# Patient Record
Sex: Female | Born: 1945 | Race: White | Hispanic: No | Smoking: Current every day smoker
Health system: Southern US, Community
[De-identification: ages and names within clinical notes are randomized; demographics above are authoritative.]

## PROBLEM LIST (undated history)

## (undated) DIAGNOSIS — C44300 Unspecified malignant neoplasm of skin of unspecified part of face: Secondary | ICD-10-CM

## (undated) DIAGNOSIS — E0590 Thyrotoxicosis, unspecified without thyrotoxic crisis or storm: Secondary | ICD-10-CM

## (undated) HISTORY — DX: Thyrotoxicosis, unspecified without thyrotoxic crisis or storm: E05.90

## (undated) HISTORY — PX: LAPAROSCOPIC TUBAL LIGATION: SUR803

## (undated) HISTORY — DX: Unspecified malignant neoplasm of skin of unspecified part of face: C44.300

## (undated) HISTORY — PX: FOOT SURGERY: SHX648

## (undated) HISTORY — PX: HAND SURGERY: SHX662

---

## 2000-08-11 HISTORY — PX: BREAST BIOPSY: SHX20

## 2007-09-15 ENCOUNTER — Ambulatory Visit: Payer: Self-pay | Admitting: Urology

## 2008-12-05 IMAGING — CT CT ABDOMEN AND PELVIS WITHOUT AND WITH CONTRAST
2 of 4 series · 13 of 32 positions shown, 18 images · non-contrast
Comparison: none

REASON FOR EXAM: Hematuria, renal colic, history of renal mass
COMMENTS:

[Series 2: wo · axial · 0.61mm/px · z∈[-1024,-684]mm · 8 of 88 slices shown, 13 images]
[im 10/88  soft-tissue]
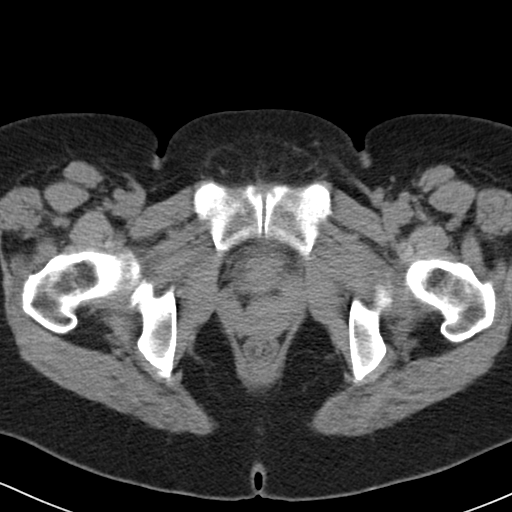
[im 10/88  bone]
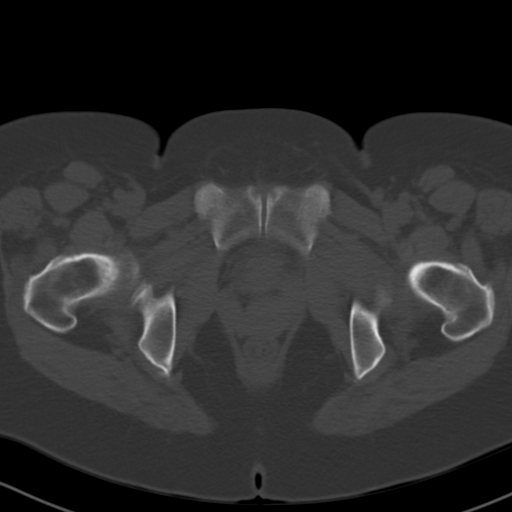
[im 20/88  soft-tissue]
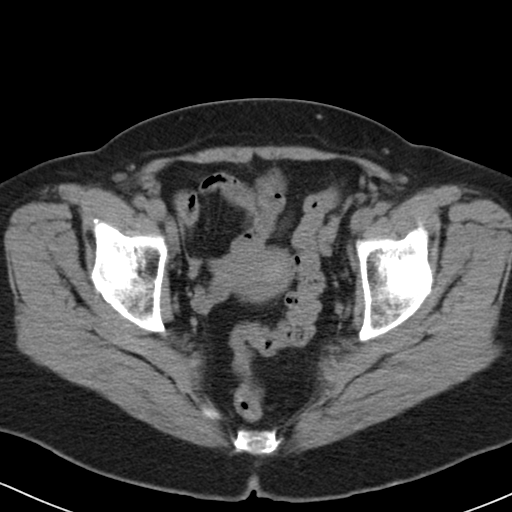
[im 30/88  soft-tissue]
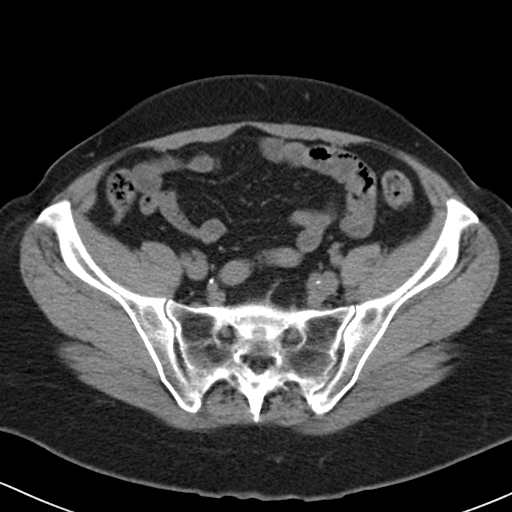
[im 39/88  soft-tissue]
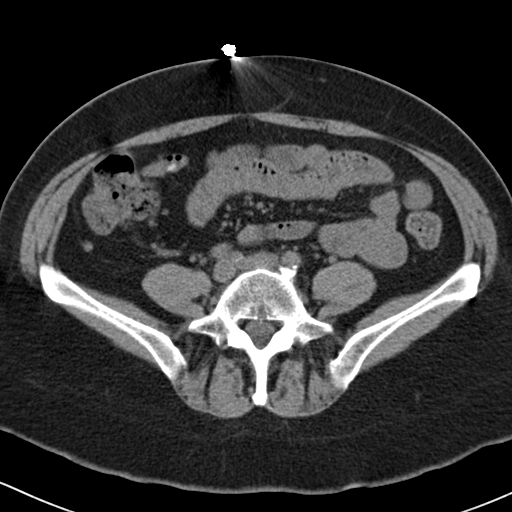
[im 49/88  soft-tissue]
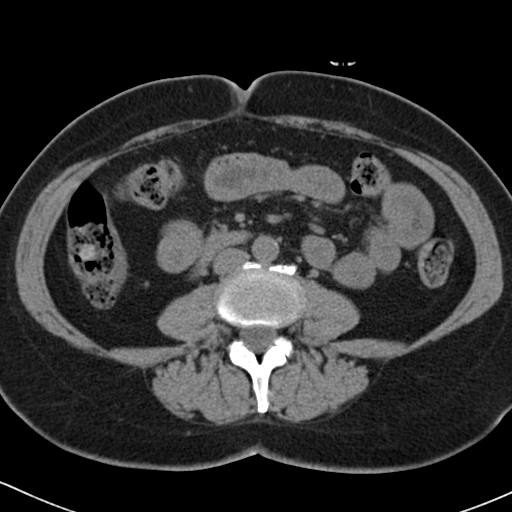
[im 49/88  lung]
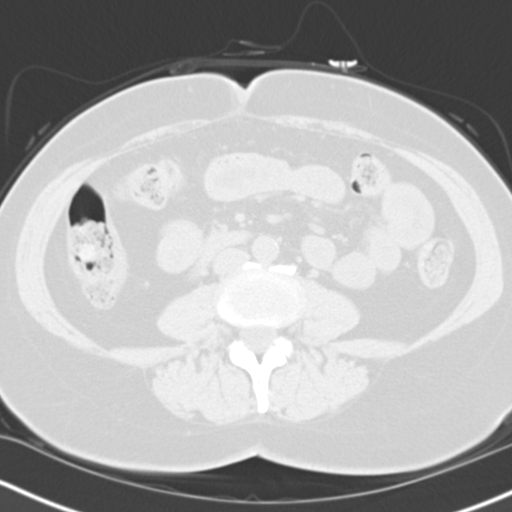
[im 59/88  soft-tissue]
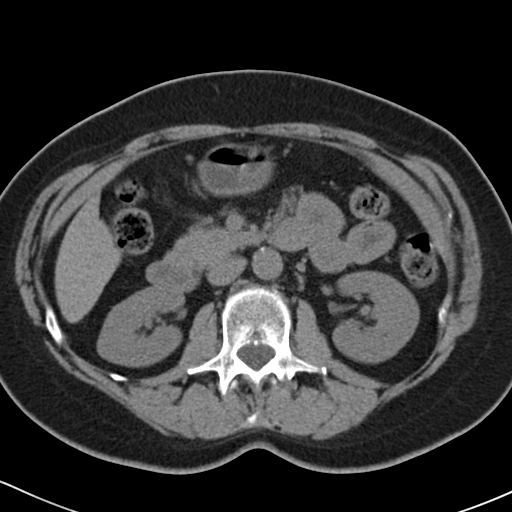
[im 59/88  lung]
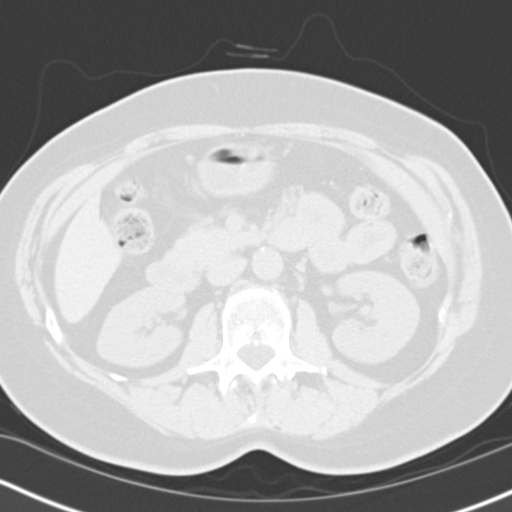
[im 68/88  soft-tissue]
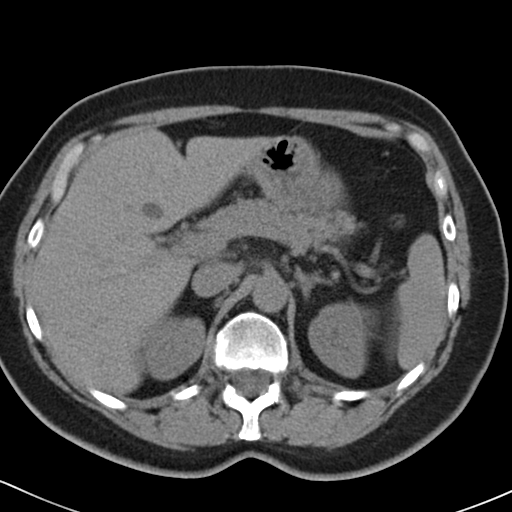
[im 68/88  lung]
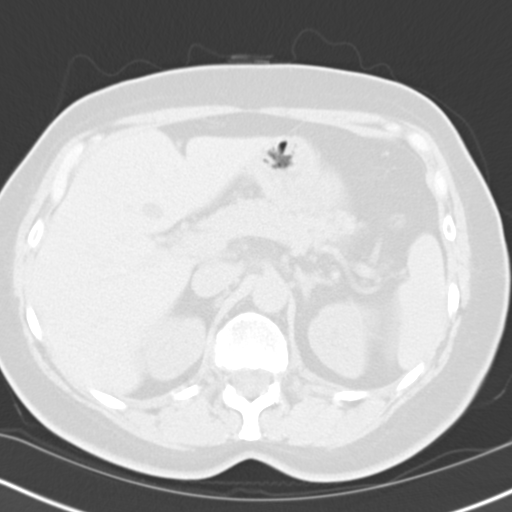
[im 78/88  soft-tissue]
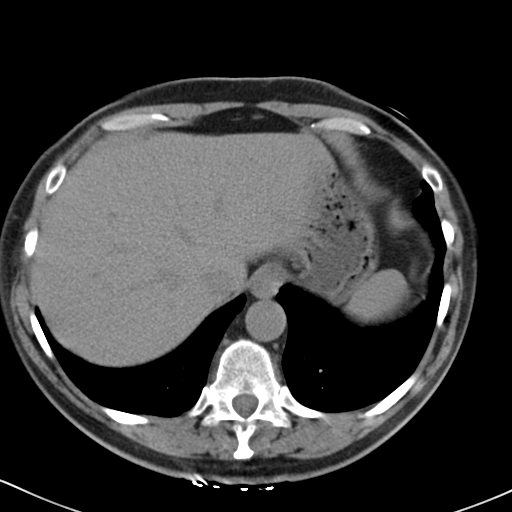
[im 78/88  lung]
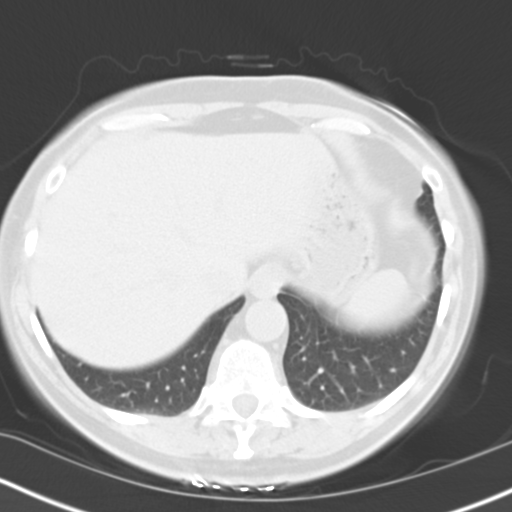

[Series 3: with · axial · 0.61mm/px · z∈[-1024,-829]mm · 5 of 88 slices shown]
[im 10/88  soft-tissue]
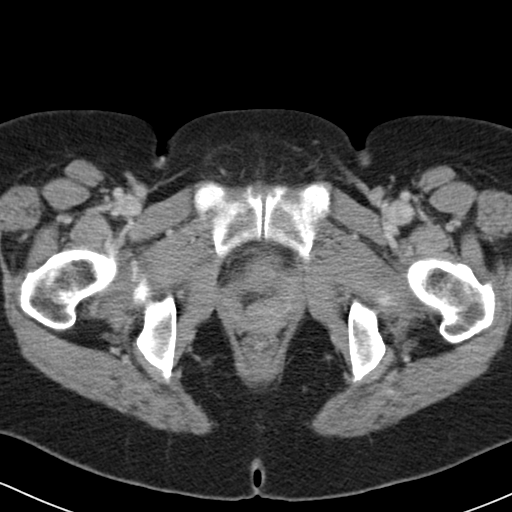
[im 20/88  soft-tissue]
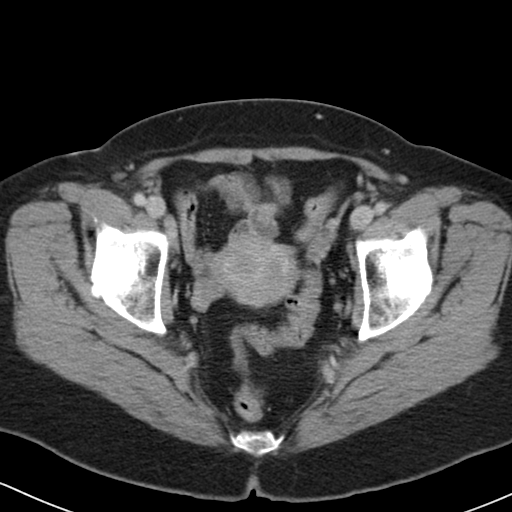
[im 30/88  soft-tissue]
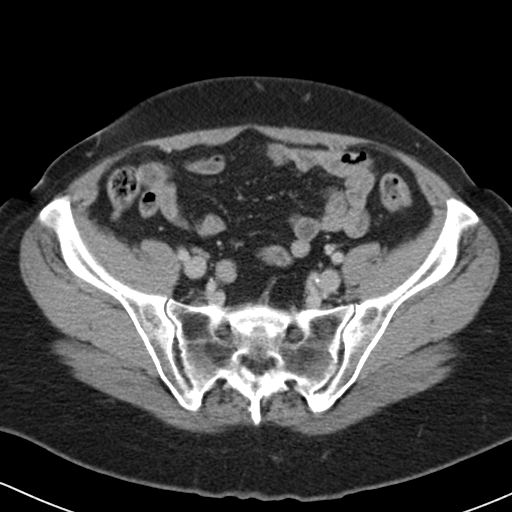
[im 39/88  soft-tissue]
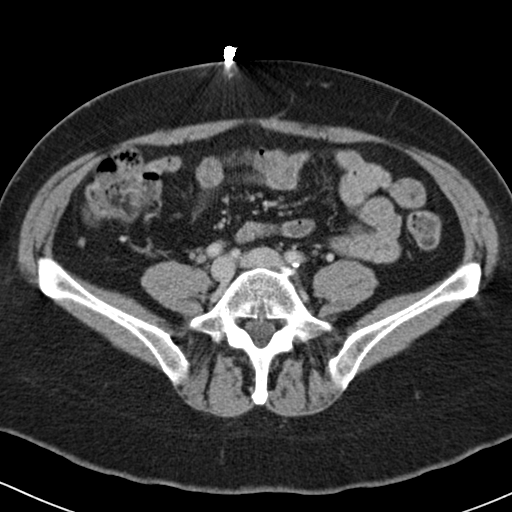
[im 49/88  soft-tissue]
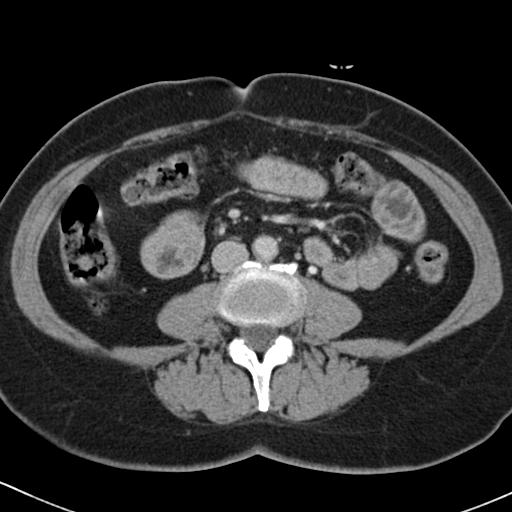

[13 of 32 positions shown; findings below may reference images not displayed]

PROCEDURE:     CT  - CT ABDOMEN / PELVIS  W/WO  - September 15, 2007  [DATE]

RESULT:     Triphasic scan was performed due to history of renal mass, renal
colic and hematuria.

Neither the RIGHT nor LEFT kidney exhibits evidence of obstruction or
calcified stones. There is a subcentimeter hypodensity in the upper pole of
the RIGHT kidney laterally measuring minus 42 Hounsfield units compatible
with a fat containing structure such as an angiomyolipoma. No calcifications
are demonstrated within either kidney. Following intravenous administration
of 100 ml of [id number], the kidneys enhance well. The subtle hypodensity in
the upper pole on the RIGHT laterally remains hypodense. The intrarenal
collecting systems and ureters are normal in appearance on the delayed
images. The partially distended urinary bladder is grossly normal.

The liver exhibits a hypodensity in the LEFT lobe measuring approximately
1.5 x 1.9 cm compatible with a cyst. There is no intrahepatic ductal
dilation. The gallbladder, stomach, spleen, adrenal glands and pancreas
exhibit no acute abnormality. The caliber of the abdominal aorta is normal.
The lung bases are clear. The unopacified loops of small and large bowel
exhibit no evidence of ileus or obstruction. The appendix is demonstrated
and appears normal. There are calcifications within the uterus and there is
heterogeneous density within the uterus. This likely reflects the presence
of fibroids. Further evaluation with Pelvic Ultrasound would be of value.
There may be a tiny cyst in the RIGHT ovary.
IMPRESSION: 1.  I do not see acute abnormality of either kidney. Specifically, no
calcified stones are noted. A tiny, fat containing lesion in the upper pole
on the RIGHT is present.
2.  I see no acute hepatobiliary abnormality.
3.  No acute bowel abnormality is seen.
4.  There is heterogeneous density within the uterus. Pelvic Ultrasound is
recommended.

## 2015-09-10 ENCOUNTER — Ambulatory Visit (INDEPENDENT_AMBULATORY_CARE_PROVIDER_SITE_OTHER): Payer: PPO | Admitting: Family Medicine

## 2015-09-10 ENCOUNTER — Ambulatory Visit
Admission: RE | Admit: 2015-09-10 | Discharge: 2015-09-10 | Disposition: A | Discharge status: Home / Self Care | Payer: PPO | Source: Ambulatory Visit | Attending: Family Medicine | Admitting: Family Medicine

## 2015-09-10 ENCOUNTER — Encounter: Payer: Self-pay | Admitting: Family Medicine

## 2015-09-10 VITALS — BP 157/95 | HR 85 | Temp 98.2°F | Resp 16 | Ht 62.0 in | Wt 158.0 lb

## 2015-09-10 DIAGNOSIS — R69 Illness, unspecified: Principal | ICD-10-CM

## 2015-09-10 DIAGNOSIS — R05 Cough: Secondary | ICD-10-CM | POA: Insufficient documentation

## 2015-09-10 DIAGNOSIS — J111 Influenza due to unidentified influenza virus with other respiratory manifestations: Secondary | ICD-10-CM

## 2015-09-10 DIAGNOSIS — J40 Bronchitis, not specified as acute or chronic: Secondary | ICD-10-CM

## 2015-09-10 DIAGNOSIS — R0602 Shortness of breath: Secondary | ICD-10-CM | POA: Diagnosis not present

## 2015-09-10 LAB — POCT INFLUENZA A/B
INFLUENZA A, POC: NEGATIVE
Influenza B, POC: NEGATIVE

## 2015-09-10 MED ORDER — FLUTICASONE PROPIONATE HFA 110 MCG/ACT IN AERO: 1.0000 | INHALATION_SPRAY | Freq: Two times a day (BID) | RESPIRATORY_TRACT | Status: DC

## 2015-09-10 MED ORDER — ALBUTEROL SULFATE HFA 108 (90 BASE) MCG/ACT IN AERS: 2.0000 | INHALATION_SPRAY | Freq: Four times a day (QID) | RESPIRATORY_TRACT | Status: DC | PRN

## 2015-09-10 MED ORDER — LEVOFLOXACIN 500 MG PO TABS: 500.0000 mg | ORAL_TABLET | Freq: Every day | ORAL | Status: DC

## 2015-09-10 MED ORDER — OSELTAMIVIR PHOSPHATE 75 MG PO CAPS: 75.0000 mg | ORAL_CAPSULE | Freq: Two times a day (BID) | ORAL | Status: DC

## 2015-09-10 NOTE — Progress Notes (Signed)
Name: Sarah Adkins   MRN: [id number]    DOB: 16-Jun-1946   Date:09/10/2015       Progress Note  Subjective  Chief Complaint  Chief Complaint  Patient presents with  . New Patient (Initial Visit)  . Cough    HPI Here to establish care.  She has \\developed  a cough with chills and fever (100.1).  Sx x 3 days.  Cough productive of clear sputum.  She is a 1/2 ppd smoker.  Her brother in law ans sister have been dx'd with the flu  She has HBP, post herpetic neuralgia, hypothyroidism.  No problem-specific assessment & plan notes found for this encounter.   Past Medical History  Diagnosis Date  . Hypertension   . Hyperthyroidism     Past Surgical History  Procedure Laterality Date  . Hand surgery Bilateral   . Laparoscopic tubal ligation    . Foot surgery Left     Family History  Problem Relation Age of Onset  . Stroke Mother   . Heart attack Mother   . Hypertension Mother   . Congestive Heart Failure Father   . Seizures Brother     Social History   Social History  . Marital Status: Married    Spouse Name: N/A  . Number of Children: N/A  . Years of Education: N/A   Occupational History  . Not on file.   Social History Main Topics  . Smoking status: Current Every Day Smoker -- 0.50 packs/day for 50 years    Types: Cigarettes  . Smokeless tobacco: Never Used  . Alcohol Use: 0.0 oz/week    0 Standard drinks or equivalent per week     Comment: occasional beer  . Drug Use: No  . Sexual Activity: Not on file   Other Topics Concern  . Not on file   Social History Narrative  . No narrative on file     Current outpatient prescriptions:  .  aspirin EC 325 MG tablet, Take 325 mg by mouth daily., Disp: , Rfl:  .  atorvastatin (LIPITOR) 20 MG tablet, Take 1 tablet by mouth at bedtime., Disp: , Rfl:  .  Cholecalciferol (VITAMIN D3) 1000 units CAPS, Take 1,000 Units by mouth daily., Disp: , Rfl:  .  Cyanocobalamin-Salcaprozate 1000-100 MCG-MG TABS, Take 1 tablet by  mouth daily., Disp: , Rfl:  .  gabapentin (NEURONTIN) 100 MG capsule, Take 100 mg by mouth 4 (four) times daily. , Disp: , Rfl: 3 .  hydrochlorothiazide (HYDRODIURIL) 25 MG tablet, Take 25 mg by mouth daily., Disp: , Rfl:  .  levothyroxine (SYNTHROID, LEVOTHROID) 100 MCG tablet, Take 1 tablet by mouth every morning., Disp: , Rfl:  .  lisinopril (PRINIVIL,ZESTRIL) 20 MG tablet, Take 20 mg by mouth daily., Disp: , Rfl: 2 .  metoprolol succinate (TOPROL-XL) 50 MG 24 hr tablet, Take 50 mg by mouth 2 (two) times daily., Disp: , Rfl:  .  PREVNAR 13 SUSP injection, Inject 0.5 mLs into the muscle as directed., Disp: , Rfl: 0  Allergies  Allergen Reactions  . Erythromycin Hives     Review of Systems  Constitutional: Positive for fever, chills and malaise/fatigue. Negative for weight loss.  HENT: Negative for hearing loss.   Eyes: Negative for blurred vision and double vision.  Respiratory: Negative for cough, shortness of breath and wheezing.   Cardiovascular: Negative for chest pain, palpitations and leg swelling.  Gastrointestinal: Negative for heartburn, abdominal pain and blood in stool.  Genitourinary: Negative for dysuria, urgency  and frequency.  Musculoskeletal: Positive for myalgias. Negative for joint pain.  Skin: Negative for rash.  Neurological: Positive for weakness. Negative for headaches.      Objective  Filed Vitals:   09/10/15 1007  BP: 157/95  Pulse: 85  Temp: 98.2 F (36.8 C)  TempSrc: Oral  Resp: 16  Height: 5\' 2"  (1.575 m)  Weight: 158 lb (71.668 kg)    Physical Exam  Constitutional: She is well-developed, well-nourished, and in no distress. No distress.  HENT:  Head: Normocephalic and atraumatic.  Right Ear: External ear normal.  Left Ear: External ear normal.  Nose: Nose normal.  Mouth/Throat: Oropharynx is clear and moist. No oropharyngeal exudate.  Neck: No thyromegaly present.  Cardiovascular: Normal rate, regular rhythm and normal heart sounds.   Exam reveals no gallop and no friction rub.   No murmur heard. Pulmonary/Chest: Effort normal and breath sounds normal. No respiratory distress. She has no wheezes. She has no rales.  Lymphadenopathy:    She has no cervical adenopathy.  Vitals reviewed.      No results found for this or any previous visit (from the past 2160 hour(s)).   Assessment & Plan  Problem List Items Addressed This Visit    None      Meds ordered this encounter  Medications  . DISCONTD: lisinopril-hydrochlorothiazide (PRINZIDE,ZESTORETIC) 20-12.5 MG tablet    Sig: Take 1 tablet by mouth daily. Reported on 09/10/2015  . atorvastatin (LIPITOR) 20 MG tablet    Sig: Take 1 tablet by mouth at bedtime.  Marland Kitchen levothyroxine (SYNTHROID, LEVOTHROID) 100 MCG tablet    Sig: Take 1 tablet by mouth every morning.  . hydrochlorothiazide (HYDRODIURIL) 25 MG tablet    Sig: Take 25 mg by mouth daily.  . metoprolol succinate (TOPROL-XL) 50 MG 24 hr tablet    Sig: Take 50 mg by mouth 2 (two) times daily.  . Cholecalciferol (VITAMIN D3) 1000 units CAPS    Sig: Take 1,000 Units by mouth daily.  Marland Kitchen aspirin EC 325 MG tablet    Sig: Take 325 mg by mouth daily.  Marland Kitchen gabapentin (NEURONTIN) 100 MG capsule    Sig: Take 100 mg by mouth 4 (four) times daily.     Refill:  3  . lisinopril (PRINIVIL,ZESTRIL) 20 MG tablet    Sig: Take 20 mg by mouth daily.    Refill:  2  . PREVNAR 13 SUSP injection    Sig: Inject 0.5 mLs into the muscle as directed.    Refill:  0  . Cyanocobalamin-Salcaprozate 1000-100 MCG-MG TABS    Sig: Take 1 tablet by mouth daily.    1. Influenza-like illness  - DG Chest 2 View; Future - oseltamivir (TAMIFLU) 75 MG capsule; Take 1 capsule (75 mg total) by mouth 2 (two) times daily.  Dispense: 10 capsule; Refill: 0 - POCT Influenza A/B-neg  2. Bronchitis  - albuterol (PROVENTIL HFA;VENTOLIN HFA) 108 (90 Base) MCG/ACT inhaler; Inhale 2 puffs into the lungs every 6 (six) hours as needed for wheezing or  shortness of breath.  Dispense: 1 Inhaler; Refill: 0 - levofloxacin (LEVAQUIN) 500 MG tablet; Take 1 tablet (500 mg total) by mouth daily.  Dispense: 7 tablet; Refill: 0 - fluticasone (FLOVENT HFA) 110 MCG/ACT inhaler; Inhale 1 puff into the lungs 2 (two) times daily. Rinse mouth with water after use.  Dispense: 1 Inhaler; Refill: 12

## 2015-09-10 NOTE — Patient Instructions (Addendum)
Advised to stop smoking.  Drink plenty of fluids.

## 2015-10-01 ENCOUNTER — Ambulatory Visit: Payer: PPO | Admitting: Family Medicine

## 2015-10-02 ENCOUNTER — Ambulatory Visit (INDEPENDENT_AMBULATORY_CARE_PROVIDER_SITE_OTHER): Payer: PPO | Admitting: Family Medicine

## 2015-10-02 ENCOUNTER — Encounter: Payer: Self-pay | Admitting: Family Medicine

## 2015-10-02 VITALS — BP 135/65 | HR 62 | Temp 98.1°F | Resp 16 | Ht 62.0 in | Wt 159.4 lb

## 2015-10-02 DIAGNOSIS — I10 Essential (primary) hypertension: Secondary | ICD-10-CM | POA: Insufficient documentation

## 2015-10-02 DIAGNOSIS — E038 Other specified hypothyroidism: Secondary | ICD-10-CM | POA: Diagnosis not present

## 2015-10-02 DIAGNOSIS — E039 Hypothyroidism, unspecified: Secondary | ICD-10-CM | POA: Insufficient documentation

## 2015-10-02 DIAGNOSIS — E034 Atrophy of thyroid (acquired): Secondary | ICD-10-CM | POA: Diagnosis not present

## 2015-10-02 DIAGNOSIS — B0229 Other postherpetic nervous system involvement: Secondary | ICD-10-CM

## 2015-10-02 NOTE — Progress Notes (Signed)
Name: Sarah Adkins   MRN: [id number]    DOB: [birth year]   Date:10/02/2015       Progress Note  Subjective  Chief Complaint  Chief Complaint  Patient presents with  . Hypertension    Follow up    HPI Here for f/u of HBP and hypothyroidism.  She has Post herpetic neuralgia.    She is recovering from the flu and bronchitis.    No problem-specific assessment & plan notes found for this encounter.   Past Medical History  Diagnosis Date  . Hypertension   . Hyperthyroidism     Past Surgical History  Procedure Laterality Date  . Hand surgery Bilateral   . Laparoscopic tubal ligation    . Foot surgery Left     Family History  Problem Relation Age of Onset  . Stroke Mother   . Heart attack Mother   . Hypertension Mother   . Congestive Heart Failure Father   . Seizures Brother     Social History   Social History  . Marital Status: Married    Spouse Name: N/A  . Number of Children: N/A  . Years of Education: N/A   Occupational History  . Not on file.   Social History Main Topics  . Smoking status: Current Every Day Smoker -- 0.50 packs/day for 50 years    Types: Cigarettes  . Smokeless tobacco: Never Used  . Alcohol Use: 0.0 oz/week    0 Standard drinks or equivalent per week     Comment: occasional beer  . Drug Use: No  . Sexual Activity: Not on file   Other Topics Concern  . Not on file   Social History Narrative     Current outpatient prescriptions:  .  albuterol (PROVENTIL HFA;VENTOLIN HFA) 108 (90 Base) MCG/ACT inhaler, Inhale 2 puffs into the lungs every 6 (six) hours as needed for wheezing or shortness of breath., Disp: 1 Inhaler, Rfl: 0 .  aspirin EC 325 MG tablet, Take 325 mg by mouth daily., Disp: , Rfl:  .  atorvastatin (LIPITOR) 20 MG tablet, Take 1 tablet by mouth at bedtime., Disp: , Rfl:  .  Cholecalciferol (VITAMIN D3) 1000 units CAPS, Take 1,000 Units by mouth daily., Disp: , Rfl:  .  Cyanocobalamin-Salcaprozate 1000-100 MCG-MG TABS, Take 1  tablet by mouth daily., Disp: , Rfl:  .  fluticasone (FLOVENT HFA) 110 MCG/ACT inhaler, Inhale 1 puff into the lungs 2 (two) times daily. Rinse mouth with water after use., Disp: 1 Inhaler, Rfl: 12 .  gabapentin (NEURONTIN) 100 MG capsule, Take 100 mg by mouth 4 (four) times daily. , Disp: , Rfl: 3 .  hydrochlorothiazide (HYDRODIURIL) 25 MG tablet, Take 25 mg by mouth daily., Disp: , Rfl:  .  levothyroxine (SYNTHROID, LEVOTHROID) 100 MCG tablet, Take 1 tablet by mouth every morning., Disp: , Rfl:  .  lisinopril (PRINIVIL,ZESTRIL) 20 MG tablet, Take 20 mg by mouth daily., Disp: , Rfl: 2 .  metoprolol succinate (TOPROL-XL) 50 MG 24 hr tablet, Take 50 mg by mouth 2 (two) times daily., Disp: , Rfl:  .  levofloxacin (LEVAQUIN) 500 MG tablet, Take 1 tablet (500 mg total) by mouth daily. (Patient not taking: Reported on 10/02/2015), Disp: 7 tablet, Rfl: 0 .  oseltamivir (TAMIFLU) 75 MG capsule, Take 1 capsule (75 mg total) by mouth 2 (two) times daily. (Patient not taking: Reported on 10/02/2015), Disp: 10 capsule, Rfl: 0 .  PREVNAR 13 SUSP injection, Inject 0.5 mLs into the muscle as directed., Disp: ,  Rfl: 0  Allergies  Allergen Reactions  . Erythromycin Hives     Review of Systems  Constitutional: Negative for fever, chills, weight loss and malaise/fatigue.  HENT: Negative for hearing loss.   Eyes: Negative for blurred vision and double vision.  Respiratory: Negative for cough, hemoptysis and wheezing.   Cardiovascular: Negative for chest pain, palpitations and leg swelling.  Gastrointestinal: Negative for heartburn, abdominal pain and blood in stool.  Genitourinary: Negative for dysuria, urgency and frequency.  Musculoskeletal: Negative for myalgias and joint pain.  Skin: Negative for rash.  Neurological: Negative for dizziness, tremors, weakness and headaches.      Objective  Filed Vitals:   10/02/15 1546 10/02/15 1624  BP: 122/84 135/65  Pulse: 62   Temp: 98.1 F (36.7 C)    TempSrc: Oral   Resp: 16   Height: 5\' 2"  (1.575 m)   Weight: 159 lb 6.4 oz (72.303 kg)     Physical Exam  Constitutional: She is oriented to person, place, and time and well-developed, well-nourished, and in no distress. No distress.  HENT:  Head: Normocephalic and atraumatic.  Eyes: Conjunctivae and EOM are normal. Pupils are equal, round, and reactive to light. No scleral icterus.  Neck: Normal range of motion. Neck supple. Carotid bruit is not present. No thyromegaly present.  Cardiovascular: Normal rate, regular rhythm and normal heart sounds.  Exam reveals no gallop and no friction rub.   No murmur heard. Pulmonary/Chest: Effort normal and breath sounds normal. No respiratory distress. She has no wheezes. She has no rales.  Abdominal: Soft. Bowel sounds are normal. She exhibits no distension, no abdominal bruit and no mass. There is no tenderness.  Musculoskeletal: Normal range of motion. She exhibits no edema.  Lymphadenopathy:    She has no cervical adenopathy.  Neurological: She is alert and oriented to person, place, and time.  Vitals reviewed.      Recent Results (from the past 2160 hour(s))  POCT Influenza A/B     Status: Normal   Collection Time: 09/10/15 11:11 AM  Result Value Ref Range   Influenza A, POC Negative Negative   Influenza B, POC Negative Negative     Assessment & Plan  Problem List Items Addressed This Visit      Cardiovascular and Mediastinum   HBP (high blood pressure) - Primary   Relevant Orders   Comprehensive Metabolic Panel (CMET)   Lipid Profile     Endocrine   Hypothyroidism   Relevant Orders   TSH     Nervous and Auditory   Post herpetic neuralgia   Relevant Orders   CBC with Differential      No orders of the defined types were placed in this encounter.   1. Essential hypertension Cont. 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 Post herpetic neuralgia Cont med - CBC with Differential

## 2015-11-12 DIAGNOSIS — B0229 Other postherpetic nervous system involvement: Secondary | ICD-10-CM | POA: Diagnosis not present

## 2015-11-12 DIAGNOSIS — I10 Essential (primary) hypertension: Secondary | ICD-10-CM | POA: Diagnosis not present

## 2015-11-12 DIAGNOSIS — E034 Atrophy of thyroid (acquired): Secondary | ICD-10-CM | POA: Diagnosis not present

## 2015-11-12 DIAGNOSIS — E038 Other specified hypothyroidism: Secondary | ICD-10-CM | POA: Diagnosis not present

## 2015-11-13 LAB — CBC WITH DIFFERENTIAL/PLATELET
BASOS: 0 %
Basophils Absolute: 0 10*3/uL (ref 0.0–0.2)
EOS (ABSOLUTE): 0.1 10*3/uL (ref 0.0–0.4)
Eos: 2 %
HEMATOCRIT: 44.6 % (ref 34.0–46.6)
Hemoglobin: 15.8 g/dL (ref 11.1–15.9)
IMMATURE GRANS (ABS): 0 10*3/uL (ref 0.0–0.1)
Immature Granulocytes: 0 %
LYMPHS ABS: 2.4 10*3/uL (ref 0.7–3.1)
LYMPHS: 30 %
MCH: 30.7 pg (ref 26.6–33.0)
MCHC: 35.4 g/dL (ref 31.5–35.7)
MCV: 87 fL (ref 79–97)
Monocytes Absolute: 0.6 10*3/uL (ref 0.1–0.9)
Monocytes: 7 %
NEUTROS ABS: 4.9 10*3/uL (ref 1.4–7.0)
Neutrophils: 61 %
Platelets: 220 10*3/uL (ref 150–379)
RBC: 5.15 x10E6/uL (ref 3.77–5.28)
RDW: 13.5 % (ref 12.3–15.4)
WBC: 8.1 10*3/uL (ref 3.4–10.8)

## 2015-11-13 LAB — COMPREHENSIVE METABOLIC PANEL
A/G RATIO: 2 (ref 1.2–2.2)
ALT: 18 IU/L (ref 0–32)
AST: 22 IU/L (ref 0–40)
Albumin: 4.3 g/dL (ref 3.6–4.8)
Alkaline Phosphatase: 91 IU/L (ref 39–117)
BUN/Creatinine Ratio: 29 — ABNORMAL HIGH (ref 12–28)
BUN: 22 mg/dL (ref 8–27)
Bilirubin Total: 1 mg/dL (ref 0.0–1.2)
CALCIUM: 9.7 mg/dL (ref 8.7–10.3)
CO2: 25 mmol/L (ref 18–29)
CREATININE: 0.77 mg/dL (ref 0.57–1.00)
Chloride: 105 mmol/L (ref 96–106)
GFR calc Af Amer: 92 mL/min/{1.73_m2} (ref >59)
GFR, EST NON AFRICAN AMERICAN: 80 mL/min/{1.73_m2} (ref >59)
GLOBULIN, TOTAL: 2.2 g/dL (ref 1.5–4.5)
Glucose: 96 mg/dL (ref 65–99)
Potassium: 4 mmol/L (ref 3.5–5.2)
Sodium: 140 mmol/L (ref 134–144)
TOTAL PROTEIN: 6.5 g/dL (ref 6.0–8.5)

## 2015-11-13 LAB — LIPID PANEL
CHOL/HDL RATIO: 2.7 ratio (ref 0.0–4.4)
CHOLESTEROL TOTAL: 172 mg/dL (ref 100–199)
HDL: 64 mg/dL (ref >39)
LDL CALC: 87 mg/dL (ref 0–99)
TRIGLYCERIDES: 107 mg/dL (ref 0–149)
VLDL Cholesterol Cal: 21 mg/dL (ref 5–40)

## 2015-11-13 LAB — TSH: TSH: 1.5 u[IU]/mL (ref 0.450–4.500)

## 2015-11-19 ENCOUNTER — Other Ambulatory Visit: Payer: Self-pay | Admitting: Family Medicine

## 2015-12-07 ENCOUNTER — Other Ambulatory Visit: Payer: Self-pay | Admitting: Family Medicine

## 2015-12-07 ENCOUNTER — Telehealth: Payer: Self-pay | Admitting: *Deleted

## 2015-12-07 DIAGNOSIS — R911 Solitary pulmonary nodule: Secondary | ICD-10-CM

## 2015-12-07 NOTE — Telephone Encounter (Signed)
Called patient to see if she ever had f/u CT scan recommended from xray done 03/2015. Patient insist that she has never had an x-ray done at [facility].  Patient was seen at [facility] for a UTI but she says she did not have any radiology. Patient informed that Dr. Luan Pulling would be notified.

## 2015-12-10 NOTE — Telephone Encounter (Signed)
Cancel CT scan until we can get this figured out.-jh

## 2015-12-11 NOTE — Telephone Encounter (Signed)
Done.[place] 

## 2016-02-05 ENCOUNTER — Encounter: Payer: Self-pay | Admitting: Family Medicine

## 2016-02-05 ENCOUNTER — Ambulatory Visit (INDEPENDENT_AMBULATORY_CARE_PROVIDER_SITE_OTHER): Payer: PPO | Admitting: Family Medicine

## 2016-02-05 VITALS — BP 110/60 | HR 60 | Temp 98.2°F | Resp 16 | Ht 62.0 in | Wt 161.0 lb

## 2016-02-05 DIAGNOSIS — B0229 Other postherpetic nervous system involvement: Secondary | ICD-10-CM | POA: Diagnosis not present

## 2016-02-05 DIAGNOSIS — I10 Essential (primary) hypertension: Secondary | ICD-10-CM | POA: Diagnosis not present

## 2016-02-05 DIAGNOSIS — E038 Other specified hypothyroidism: Secondary | ICD-10-CM

## 2016-02-05 DIAGNOSIS — E785 Hyperlipidemia, unspecified: Secondary | ICD-10-CM

## 2016-02-05 DIAGNOSIS — E034 Atrophy of thyroid (acquired): Secondary | ICD-10-CM | POA: Diagnosis not present

## 2016-02-05 MED ORDER — ATORVASTATIN CALCIUM 10 MG PO TABS: 20.0000 mg | ORAL_TABLET | Freq: Every day | ORAL | Status: DC

## 2016-02-05 MED ORDER — METOPROLOL TARTRATE 50 MG PO TABS: 50.0000 mg | ORAL_TABLET | Freq: Two times a day (BID) | ORAL | Status: DC

## 2016-02-05 MED ORDER — LISINOPRIL 20 MG PO TABS: 20.0000 mg | ORAL_TABLET | Freq: Every day | ORAL | Status: DC

## 2016-02-05 MED ORDER — GABAPENTIN 100 MG PO CAPS: ORAL_CAPSULE | ORAL | Status: DC

## 2016-02-05 MED ORDER — HYDROCHLOROTHIAZIDE 12.5 MG PO TABS: 12.5000 mg | ORAL_TABLET | Freq: Every day | ORAL | Status: DC

## 2016-02-05 MED ORDER — LEVOTHYROXINE SODIUM 100 MCG PO TABS: ORAL_TABLET | ORAL | Status: DC

## 2016-02-05 NOTE — Progress Notes (Signed)
Name: Sarah Adkins   MRN: [id number]    DOB: June 17, 1946   Date:02/05/2016       Progress Note  Subjective  Chief Complaint  Chief Complaint  Patient presents with  . Hypertension  . Hypothyroidism    HPI Here for f/u of HBP and hypothyroidism.  Has post- herpetic neuralgia. Still with some pain in face.  She is feeling well overall.  Labs were all ok 2 months ago.  No problem-specific assessment & plan notes found for this encounter.   Past Medical History  Diagnosis Date  . Hypertension   . Hyperthyroidism     Past Surgical History  Procedure Laterality Date  . Hand surgery Bilateral   . Laparoscopic tubal ligation    . Foot surgery Left     Family History  Problem Relation Age of Onset  . Stroke Mother   . Heart attack Mother   . Hypertension Mother   . Congestive Heart Failure Father   . Seizures Brother     Social History   Social History  . Marital Status: Married    Spouse Name: N/A  . Number of Children: N/A  . Years of Education: N/A   Occupational History  . Not on file.   Social History Main Topics  . Smoking status: Current Every Day Smoker -- 0.50 packs/day for 50 years    Types: Cigarettes  . Smokeless tobacco: Never Used  . Alcohol Use: 0.0 oz/week    0 Standard drinks or equivalent per week     Comment: occasional beer  . Drug Use: No  . Sexual Activity: Not on file   Other Topics Concern  . Not on file   Social History Narrative     Current outpatient prescriptions:  .  aspirin EC 325 MG tablet, Take 325 mg by mouth daily., Disp: , Rfl:  .  atorvastatin (LIPITOR) 10 MG tablet, Take 2 tablets (20 mg total) by mouth at bedtime., Disp: 90 tablet, Rfl: 3 .  Cholecalciferol (VITAMIN D3) 1000 units CAPS, Take 1,000 Units by mouth daily., Disp: , Rfl:  .  Cyanocobalamin-Salcaprozate 1000-100 MCG-MG TABS, Take 1 tablet by mouth daily., Disp: , Rfl:  .  gabapentin (NEURONTIN) 100 MG capsule, Take 2 caps three times a day, Disp: 540  capsule, Rfl: 3 .  hydrochlorothiazide (HYDRODIURIL) 12.5 MG tablet, Take 1 tablet (12.5 mg total) by mouth daily., Disp: 90 tablet, Rfl: 3 .  levothyroxine (SYNTHROID) 100 MCG tablet, TAKE 1 TABLET BY MOUTH EVERY MORNING, except omit on Sunday, Disp: 90 tablet, Rfl: 3 .  lisinopril (PRINIVIL,ZESTRIL) 20 MG tablet, Take 1 tablet (20 mg total) by mouth daily., Disp: 90 tablet, Rfl: 3 .  PREVNAR 13 SUSP injection, Inject 0.5 mLs into the muscle as directed., Disp: , Rfl: 0 .  metoprolol (LOPRESSOR) 50 MG tablet, Take 1 tablet (50 mg total) by mouth 2 (two) times daily., Disp: 180 tablet, Rfl: 3  Allergies  Allergen Reactions  . Erythromycin Hives     Review of Systems  Constitutional: Negative for fever, chills, weight loss and malaise/fatigue.  HENT: Negative for hearing loss.   Eyes: Negative for blurred vision and double vision.  Respiratory: Negative for cough, shortness of breath and wheezing.   Cardiovascular: Negative for chest pain, palpitations and leg swelling.  Gastrointestinal: Negative for heartburn, abdominal pain and blood in stool.  Genitourinary: Negative for dysuria, urgency and frequency.  Musculoskeletal: Positive for joint pain (bilateral thumbs.). Negative for myalgias.  Skin: Negative for rash.  Neurological: Negative for dizziness, tremors, weakness and headaches.       Some R temporal facial tingling.      Objective  Filed Vitals:   02/05/16 0809 02/05/16 0849  BP: 118/69 110/60  Pulse: 56 60  Temp: 98.2 F (36.8 C)   TempSrc: Oral   Resp: 16   Height: 5\' 2"  (1.575 m)   Weight: 161 lb (73.029 kg)     Physical Exam  Constitutional: She is oriented to person, place, and time and well-developed, well-nourished, and in no distress. No distress.  HENT:  Head: Normocephalic and atraumatic.  Eyes: Conjunctivae are normal. Pupils are equal, round, and reactive to light. No scleral icterus.  Neck: Normal range of motion. Neck supple. Carotid bruit is not  present. No thyromegaly present.  Cardiovascular: Normal rate, regular rhythm and normal heart sounds.  Exam reveals no gallop and no friction rub.   No murmur heard. Pulmonary/Chest: Effort normal and breath sounds normal. No respiratory distress. She has no wheezes. She has no rales.  Abdominal: Soft. Bowel sounds are normal. She exhibits no distension, no abdominal bruit and no mass. There is no tenderness.  Musculoskeletal: She exhibits no edema.  Lymphadenopathy:    She has no cervical adenopathy.  Neurological: She is alert and oriented to person, place, and time.  Vitals reviewed.      Recent Results (from the past 2160 hour(s))  CBC with Differential     Status: None   Collection Time: 11/12/15  8:33 AM  Result Value Ref Range   WBC 8.1 3.4 - 10.8 x10E3/uL   RBC 5.15 3.77 - 5.28 x10E6/uL   Hemoglobin 15.8 11.1 - 15.9 g/dL   Hematocrit 44.6 34.0 - 46.6 %   MCV 87 79 - 97 fL   MCH 30.7 26.6 - 33.0 pg   MCHC 35.4 31.5 - 35.7 g/dL   RDW 13.5 12.3 - 15.4 %   Platelets 220 150 - 379 x10E3/uL   Neutrophils 61 %   Lymphs 30 %   Monocytes 7 %   Eos 2 %   Basos 0 %   Neutrophils Absolute 4.9 1.4 - 7.0 x10E3/uL   Lymphocytes Absolute 2.4 0.7 - 3.1 x10E3/uL   Monocytes Absolute 0.6 0.1 - 0.9 x10E3/uL   EOS (ABSOLUTE) 0.1 0.0 - 0.4 x10E3/uL   Basophils Absolute 0.0 0.0 - 0.2 x10E3/uL   Immature Granulocytes 0 %   Immature Grans (Abs) 0.0 0.0 - 0.1 x10E3/uL   Hematology Comments: Note:     Comment: Verified by microscopic examination.  Comprehensive Metabolic Panel (CMET)     Status: Abnormal   Collection Time: 11/12/15  8:33 AM  Result Value Ref Range   Glucose 96 65 - 99 mg/dL   BUN 22 8 - 27 mg/dL   Creatinine, Ser 0.77 0.57 - 1.00 mg/dL   GFR calc non Af Amer 80 >59 mL/min/1.73   GFR calc Af Amer 92 >59 mL/min/1.73   BUN/Creatinine Ratio 29 (H) 12 - 28    Comment:               **Please note reference interval change**   Sodium 140 134 - 144 mmol/L   Potassium 4.0  3.5 - 5.2 mmol/L   Chloride 105 96 - 106 mmol/L   CO2 25 18 - 29 mmol/L   Calcium 9.7 8.7 - 10.3 mg/dL   Total Protein 6.5 6.0 - 8.5 g/dL   Albumin 4.3 3.6 - 4.8 g/dL   Globulin, Total 2.2 1.5 -  4.5 g/dL   Albumin/Globulin Ratio 2.0 1.2 - 2.2    Comment:               **Please note reference interval change**   Bilirubin Total 1.0 0.0 - 1.2 mg/dL   Alkaline Phosphatase 91 39 - 117 IU/L   AST 22 0 - 40 IU/L   ALT 18 0 - 32 IU/L  Lipid Profile     Status: None   Collection Time: 11/12/15  8:33 AM  Result Value Ref Range   Cholesterol, Total 172 100 - 199 mg/dL   Triglycerides 107 0 - 149 mg/dL   HDL 64 >39 mg/dL   VLDL Cholesterol Cal 21 5 - 40 mg/dL   LDL Calculated 87 0 - 99 mg/dL   Chol/HDL Ratio 2.7 0.0 - 4.4 ratio units    Comment:                                   T. Chol/HDL Ratio                                             Men  Women                               1/2 Avg.Risk  3.4    3.3                                   Avg.Risk  5.0    4.4                                2X Avg.Risk  9.6    7.1                                3X Avg.Risk 23.4   11.0   TSH     Status: None   Collection Time: 11/12/15  8:33 AM  Result Value Ref Range   TSH 1.500 0.450 - 4.500 uIU/mL     Assessment & Plan  Problem List Items Addressed This Visit      Cardiovascular and Mediastinum   HBP (high blood pressure) - Primary   Relevant Medications   atorvastatin (LIPITOR) 10 MG tablet   hydrochlorothiazide (HYDRODIURIL) 12.5 MG tablet   lisinopril (PRINIVIL,ZESTRIL) 20 MG tablet   metoprolol (LOPRESSOR) 50 MG tablet     Endocrine   Hypothyroidism   Relevant Medications   metoprolol (LOPRESSOR) 50 MG tablet   levothyroxine (SYNTHROID) 100 MCG tablet     Nervous and Auditory   Post herpetic neuralgia   Relevant Medications   gabapentin (NEURONTIN) 100 MG capsule     Other   Hyperlipidemia   Relevant Medications   atorvastatin (LIPITOR) 10 MG tablet   hydrochlorothiazide  (HYDRODIURIL) 12.5 MG tablet   lisinopril (PRINIVIL,ZESTRIL) 20 MG tablet   metoprolol (LOPRESSOR) 50 MG tablet      Meds ordered this encounter  Medications  . atorvastatin (LIPITOR) 10 MG tablet    Sig: Take 2 tablets (20 mg total) by mouth at bedtime.  Dispense:  90 tablet    Refill:  3  . gabapentin (NEURONTIN) 100 MG capsule    Sig: Take 2 caps three times a day    Dispense:  540 capsule    Refill:  3  . hydrochlorothiazide (HYDRODIURIL) 12.5 MG tablet    Sig: Take 1 tablet (12.5 mg total) by mouth daily.    Dispense:  90 tablet    Refill:  3  . lisinopril (PRINIVIL,ZESTRIL) 20 MG tablet    Sig: Take 1 tablet (20 mg total) by mouth daily.    Dispense:  90 tablet    Refill:  3  . metoprolol (LOPRESSOR) 50 MG tablet    Sig: Take 1 tablet (50 mg total) by mouth 2 (two) times daily.    Dispense:  180 tablet    Refill:  3  . levothyroxine (SYNTHROID) 100 MCG tablet    Sig: TAKE 1 TABLET BY MOUTH EVERY MORNING, except omit on Sunday    Dispense:  90 tablet    Refill:  3   1. Essential hypertension  - hydrochlorothiazide (HYDRODIURIL) 12.5 MG tablet; Take 1 tablet (12.5 mg total) by mouth daily.  Dispense: 90 tablet; Refill: 3 -Decrease from 25 mg/d - lisinopril (PRINIVIL,ZESTRIL) 20 MG tablet; Take 1 tablet (20 mg total) by mouth daily.  Dispense: 90 tablet; Refill: 3 - metoprolol (LOPRESSOR) 50 MG tablet; Take 1 tablet (50 mg total) by mouth 2 (two) times daily.  Dispense: 180 tablet; Refill: 3  2. Hypothyroidism due to acquired atrophy of thyroid  - levothyroxine (SYNTHROID) 100 MCG tablet; TAKE 1 TABLET BY MOUTH EVERY MORNING, except omit on Sunday  Dispense: 90 tablet; Refill: 3  3. Post herpetic neuralgia  - gabapentin (NEURONTIN) 100 MG capsule; Take 2 caps three times a day  Dispense: 540 capsule; Refill: 3  4. Hyperlipidemia  - atorvastatin (LIPITOR) 10 MG tablet; Take 2 tablets (20 mg total) by mouth at bedtime.  Dispense: 90 tablet; Refill: 3

## 2016-04-15 ENCOUNTER — Ambulatory Visit (INDEPENDENT_AMBULATORY_CARE_PROVIDER_SITE_OTHER): Payer: PPO | Admitting: Family Medicine

## 2016-04-15 ENCOUNTER — Encounter: Payer: Self-pay | Admitting: Family Medicine

## 2016-04-15 VITALS — BP 130/65 | HR 67 | Temp 98.0°F | Resp 16 | Ht 62.0 in | Wt 164.0 lb

## 2016-04-15 DIAGNOSIS — E785 Hyperlipidemia, unspecified: Secondary | ICD-10-CM | POA: Diagnosis not present

## 2016-04-15 DIAGNOSIS — E034 Atrophy of thyroid (acquired): Secondary | ICD-10-CM

## 2016-04-15 DIAGNOSIS — I10 Essential (primary) hypertension: Secondary | ICD-10-CM

## 2016-04-15 DIAGNOSIS — E038 Other specified hypothyroidism: Secondary | ICD-10-CM | POA: Diagnosis not present

## 2016-04-15 LAB — COMPLETE METABOLIC PANEL WITH GFR
ALT: 15 U/L (ref 6–29)
AST: 19 U/L (ref 10–35)
Albumin: 4.2 g/dL (ref 3.6–5.1)
Alkaline Phosphatase: 88 U/L (ref 33–130)
BILIRUBIN TOTAL: 1.1 mg/dL (ref 0.2–1.2)
BUN: 20 mg/dL (ref 7–25)
CHLORIDE: 103 mmol/L (ref 98–110)
CO2: 20 mmol/L (ref 20–31)
CREATININE: 0.7 mg/dL (ref 0.50–0.99)
Calcium: 9.7 mg/dL (ref 8.6–10.4)
GFR, Est African American: >89 mL/min (ref >=60)
GFR, Est Non African American: 89 mL/min (ref >=60)
GLUCOSE: 97 mg/dL (ref 65–99)
Potassium: 3.7 mmol/L (ref 3.5–5.3)
SODIUM: 141 mmol/L (ref 135–146)
TOTAL PROTEIN: 6.4 g/dL (ref 6.1–8.1)

## 2016-04-15 LAB — LIPID PANEL
Cholesterol: 185 mg/dL (ref 125–200)
HDL: 64 mg/dL (ref >=46)
LDL CALC: 97 mg/dL (ref <130)
Total CHOL/HDL Ratio: 2.9 Ratio (ref <=5.0)
Triglycerides: 118 mg/dL (ref <150)
VLDL: 24 mg/dL (ref <30)

## 2016-04-15 MED ORDER — ATORVASTATIN CALCIUM 10 MG PO TABS
10.0000 mg | ORAL_TABLET | Freq: Every day | ORAL | 3 refills | Status: DC
Start: 2016-04-15 — End: 2017-04-22

## 2016-04-15 NOTE — Progress Notes (Signed)
Name: Sarah Adkins   MRN: [id number]    DOB: November 07, 1945   Date:04/15/2016       Progress Note  Subjective  Chief Complaint  Chief Complaint  Patient presents with  . Hyperlipidemia  . Hypertension    HPI Here for f/u of HBP and Hyperlipidemia.  She is taking 10 mg. Of Lipitor a day as prescribed.  Also taking BP meds.  She is takingThyroid meds.  Overall feeling well.  No problem-specific Assessment & Plan notes found for this encounter.   Past Medical History:  Diagnosis Date  . Hypertension   . Hyperthyroidism     Past Surgical History:  Procedure Laterality Date  . FOOT SURGERY Left   . HAND SURGERY Bilateral   . LAPAROSCOPIC TUBAL LIGATION      Family History  Problem Relation Age of Onset  . Stroke Mother   . Heart attack Mother   . Hypertension Mother   . Congestive Heart Failure Father   . Seizures Brother     Social History   Social History  . Marital status: Married    Spouse name: N/A  . Number of children: N/A  . Years of education: N/A   Occupational History  . Not on file.   Social History Main Topics  . Smoking status: Current Every Day Smoker    Packs/day: 0.50    Years: 50.00    Types: Cigarettes  . Smokeless tobacco: Never Used  . Alcohol use 0.0 oz/week     Comment: occasional beer  . Drug use: No  . Sexual activity: Not on file   Other Topics Concern  . Not on file   Social History Narrative  . No narrative on file     Current Outpatient Prescriptions:  .  aspirin EC 325 MG tablet, Take 325 mg by mouth daily., Disp: , Rfl:  .  atorvastatin (LIPITOR) 10 MG tablet, Take 1 tablet (10 mg total) by mouth at bedtime., Disp: 90 tablet, Rfl: 3 .  Cholecalciferol (VITAMIN D3) 1000 units CAPS, Take 1,000 Units by mouth daily., Disp: , Rfl:  .  Cyanocobalamin-Salcaprozate 1000-100 MCG-MG TABS, Take 1 tablet by mouth daily., Disp: , Rfl:  .  gabapentin (NEURONTIN) 100 MG capsule, Take 2 caps three times a day, Disp: 540 capsule, Rfl:  3 .  hydrochlorothiazide (HYDRODIURIL) 12.5 MG tablet, Take 1 tablet (12.5 mg total) by mouth daily., Disp: 90 tablet, Rfl: 3 .  levothyroxine (SYNTHROID) 100 MCG tablet, TAKE 1 TABLET BY MOUTH EVERY MORNING, except omit on Sunday, Disp: 90 tablet, Rfl: 3 .  lisinopril (PRINIVIL,ZESTRIL) 20 MG tablet, Take 1 tablet (20 mg total) by mouth daily., Disp: 90 tablet, Rfl: 3 .  metoprolol (LOPRESSOR) 50 MG tablet, Take 1 tablet (50 mg total) by mouth 2 (two) times daily., Disp: 180 tablet, Rfl: 3 .  PREVNAR 13 SUSP injection, Inject 0.5 mLs into the muscle as directed., Disp: , Rfl: 0  Allergies  Allergen Reactions  . Erythromycin Hives     Review of Systems  Constitutional: Negative for chills, fever, malaise/fatigue and weight loss.  HENT: Negative for hearing loss.   Eyes: Negative for blurred vision and double vision.  Respiratory: Negative for cough, shortness of breath and wheezing.   Cardiovascular: Negative for chest pain, palpitations and leg swelling.  Gastrointestinal: Negative for abdominal pain, blood in stool and heartburn.  Genitourinary: Negative for dysuria, frequency and urgency.  Musculoskeletal: Negative for joint pain and myalgias.  Skin: Negative for rash.  Neurological:  Negative for tremors, weakness and headaches.      Objective  Vitals:   04/15/16 0820 04/15/16 0849  BP: 135/88 130/65  Pulse: 67   Resp: 16   Temp: 98 F (36.7 C)   TempSrc: Oral   Weight: 164 lb (74.4 kg)   Height: 5\' 2"  (1.575 m)     Physical Exam  Constitutional: She is oriented to person, place, and time and well-developed, well-nourished, and in no distress. No distress.  HENT:  Head: Normocephalic and atraumatic.  Eyes: Conjunctivae and EOM are normal. Pupils are equal, round, and reactive to light. No scleral icterus.  Neck: Normal range of motion. Neck supple. No thyromegaly present.  Cardiovascular: Normal rate and regular rhythm.  Exam reveals no gallop and no friction rub.    No murmur heard. Pulmonary/Chest: Effort normal and breath sounds normal. No respiratory distress. She has no wheezes. She has no rales.  Abdominal: Soft. Bowel sounds are normal. She exhibits no distension and no mass. There is no tenderness.  Musculoskeletal: She exhibits no edema.  Lymphadenopathy:    She has no cervical adenopathy.  Neurological: She is alert and oriented to person, place, and time.  Vitals reviewed.      No results found for this or any previous visit (from the past 2160 hour(s)).   Assessment & Plan  Problem List Items Addressed This Visit      Cardiovascular and Mediastinum   HBP (high blood pressure) - Primary   Relevant Medications   atorvastatin (LIPITOR) 10 MG tablet   Other Relevant Orders   COMPLETE METABOLIC PANEL WITH GFR     Endocrine   Hypothyroidism   Relevant Orders   TSH     Other   Hyperlipidemia   Relevant Medications   atorvastatin (LIPITOR) 10 MG tablet   Other Relevant Orders   Lipid Profile    Other Visit Diagnoses   None.     Meds ordered this encounter  Medications  . atorvastatin (LIPITOR) 10 MG tablet    Sig: Take 1 tablet (10 mg total) by mouth at bedtime.    Dispense:  90 tablet    Refill:  3   1. Hyperlipidemia  - atorvastatin (LIPITOR) 10 MG tablet; Take 1 tablet (10 mg total) by mouth at bedtime.  Dispense: 90 tablet; Refill: 3 - Lipid Profile  2. Essential hypertension Cont meds - COMPLETE METABOLIC PANEL WITH GFR  3. Hypothyroidism due to acquired atrophy of thyroid Cont med - TSH

## 2016-04-16 LAB — TSH: TSH: 1.15 m[IU]/L

## 2016-05-07 ENCOUNTER — Ambulatory Visit
Admission: RE | Admit: 2016-05-07 | Discharge: 2016-05-07 | Disposition: A | Discharge status: Home / Self Care | Payer: PPO | Source: Ambulatory Visit | Attending: Family Medicine | Admitting: Family Medicine

## 2016-05-07 ENCOUNTER — Encounter: Payer: Self-pay | Admitting: Family Medicine

## 2016-05-07 ENCOUNTER — Ambulatory Visit (INDEPENDENT_AMBULATORY_CARE_PROVIDER_SITE_OTHER): Payer: PPO | Admitting: Family Medicine

## 2016-05-07 VITALS — BP 125/62 | HR 59 | Temp 98.1°F | Resp 16 | Ht 62.0 in | Wt 160.0 lb

## 2016-05-07 DIAGNOSIS — I700 Atherosclerosis of aorta: Secondary | ICD-10-CM | POA: Diagnosis not present

## 2016-05-07 DIAGNOSIS — J40 Bronchitis, not specified as acute or chronic: Secondary | ICD-10-CM | POA: Insufficient documentation

## 2016-05-07 DIAGNOSIS — R0602 Shortness of breath: Secondary | ICD-10-CM | POA: Diagnosis not present

## 2016-05-07 DIAGNOSIS — R05 Cough: Secondary | ICD-10-CM | POA: Diagnosis not present

## 2016-05-07 MED ORDER — ALBUTEROL SULFATE HFA 108 (90 BASE) MCG/ACT IN AERS: 2.0000 | INHALATION_SPRAY | Freq: Four times a day (QID) | RESPIRATORY_TRACT | 11 refills | Status: DC | PRN

## 2016-05-07 MED ORDER — PREDNISONE 20 MG PO TABS: 40.0000 mg | ORAL_TABLET | Freq: Every day | ORAL | 0 refills | Status: DC

## 2016-05-07 MED ORDER — DOXYCYCLINE HYCLATE 100 MG PO TABS: 100.0000 mg | ORAL_TABLET | Freq: Two times a day (BID) | ORAL | 0 refills | Status: DC

## 2016-05-07 MED ORDER — BENZONATATE 100 MG PO CAPS: 100.0000 mg | ORAL_CAPSULE | Freq: Three times a day (TID) | ORAL | 0 refills | Status: DC | PRN

## 2016-05-07 NOTE — Progress Notes (Signed)
Subjective:    Patient ID: Sarah Adkins, female    DOB: 1946/01/29, 70 y.o.   MRN: [id number]  HPI: Miyoshi Kois is a 70 y.o. female presenting on 05/07/2016 for Cough (onset week had scrathy throat but now down in her chest has SOB taken flovent inhaler not improving her Sx much)   HPI  Pt presents for cough, cold, congestion. Symptoms started last Tuesday (9/18) with scratchy and typical cold symptoms. She was feeling better last week and then it went to her chest. Chest tightness and coughing a lot. Productive of brown tinged sputum. Was given flovent inhaler with her last URI. She has been taking it and has helped with breathing issues.    Past Medical History:  Diagnosis Date  . Hypertension   . Hyperthyroidism     Current Outpatient Prescriptions on File Prior to Visit  Medication Sig  . aspirin EC 325 MG tablet Take 325 mg by mouth daily.  Marland Kitchen atorvastatin (LIPITOR) 10 MG tablet Take 1 tablet (10 mg total) by mouth at bedtime.  . Cholecalciferol (VITAMIN D3) 1000 units CAPS Take 1,000 Units by mouth daily.  . Cyanocobalamin-Salcaprozate 1000-100 MCG-MG TABS Take 1 tablet by mouth daily.  Marland Kitchen gabapentin (NEURONTIN) 100 MG capsule Take 2 caps three times a day  . hydrochlorothiazide (HYDRODIURIL) 12.5 MG tablet Take 1 tablet (12.5 mg total) by mouth daily.  Marland Kitchen levothyroxine (SYNTHROID) 100 MCG tablet TAKE 1 TABLET BY MOUTH EVERY MORNING, except omit on Sunday  . lisinopril (PRINIVIL,ZESTRIL) 20 MG tablet Take 1 tablet (20 mg total) by mouth daily.  . metoprolol (LOPRESSOR) 50 MG tablet Take 1 tablet (50 mg total) by mouth 2 (two) times daily.  Marland Kitchen PREVNAR 13 SUSP injection Inject 0.5 mLs into the muscle as directed.   No current facility-administered medications on file prior to visit.     Review of Systems  Constitutional: Negative for chills and fever.  HENT: Negative.   Respiratory: Positive for cough, chest tightness and wheezing.   Cardiovascular: Negative for chest pain and  leg swelling.  Gastrointestinal: Negative for abdominal pain, constipation, diarrhea, nausea and vomiting.  Endocrine: Negative.  Negative for cold intolerance, heat intolerance, polydipsia, polyphagia and polyuria.  Genitourinary: Negative for difficulty urinating and dysuria.  Musculoskeletal: Negative.   Neurological: Negative for dizziness, light-headedness and numbness.  Psychiatric/Behavioral: Negative.    Per HPI unless specifically indicated above     Objective:    BP 125/62   Pulse (!) 59   Temp 98.1 F (36.7 C) (Oral)   Resp 16   Ht 5\' 2"  (1.575 m)   Wt 160 lb (72.6 kg)   SpO2 93%   BMI 29.26 kg/m   Wt Readings from Last 3 Encounters:  05/07/16 160 lb (72.6 kg)  04/15/16 164 lb (74.4 kg)  02/05/16 161 lb (73 kg)    Physical Exam  Constitutional: She is oriented to person, place, and time. She appears well-developed and well-nourished.  HENT:  Head: Normocephalic and atraumatic.  Neck: Neck supple.  Cardiovascular: Normal rate, regular rhythm and normal heart sounds.  Exam reveals no gallop and no friction rub.   No murmur heard. Pulmonary/Chest: Effort normal. She has wheezes in the right middle field, the right lower field, the left middle field and the left lower field. She has no rhonchi. She has no rales. Chest wall is not dull to percussion. She exhibits no mass and no tenderness.  Abdominal: Soft. Normal appearance and bowel sounds are normal. She exhibits no  distension and no mass. There is no tenderness. There is no rebound and no guarding.  Musculoskeletal: Normal range of motion. She exhibits no edema or tenderness.  Lymphadenopathy:    She has no cervical adenopathy.  Neurological: She is alert and oriented to person, place, and time.  Skin: Skin is warm and dry.   Results for orders placed or performed in visit on 04/15/16  COMPLETE METABOLIC PANEL WITH GFR  Result Value Ref Range   Sodium 141 135 - 146 mmol/L   Potassium 3.7 3.5 - 5.3 mmol/L    Chloride 103 98 - 110 mmol/L   CO2 20 20 - 31 mmol/L   Glucose, Bld 97 65 - 99 mg/dL   BUN 20 7 - 25 mg/dL   Creat 0.70 0.50 - 0.99 mg/dL   Total Bilirubin 1.1 0.2 - 1.2 mg/dL   Alkaline Phosphatase 88 33 - 130 U/L   AST 19 10 - 35 U/L   ALT 15 6 - 29 U/L   Total Protein 6.4 6.1 - 8.1 g/dL   Albumin 4.2 3.6 - 5.1 g/dL   Calcium 9.7 8.6 - 10.4 mg/dL   GFR, Est African American >89 >=60 mL/min   GFR, Est Non African American 89 >=60 mL/min  Lipid Profile  Result Value Ref Range   Cholesterol 185 125 - 200 mg/dL   Triglycerides 118 <150 mg/dL   HDL 64 >=46 mg/dL   Total CHOL/HDL Ratio 2.9 <=5.0 Ratio   VLDL 24 <30 mg/dL   LDL Cholesterol 97 <130 mg/dL  TSH  Result Value Ref Range   TSH 1.15 mIU/L      Assessment & Plan:   Problem List Items Addressed This Visit      Respiratory   Bronchitis - Primary    Treat for bronchitis today. Prednisone, albuterol, and doxy to cover for second worsening. Supportive care at home. CXR shows possible COPD- recommend spirometry at next visit. Recommend smoking cessation.       Relevant Medications   albuterol (PROVENTIL HFA;VENTOLIN HFA) 108 (90 Base) MCG/ACT inhaler   predniSONE (DELTASONE) 20 MG tablet   doxycycline (VIBRA-TABS) 100 MG tablet   benzonatate (TESSALON) 100 MG capsule   Other Relevant Orders   DG Chest 2 View (Completed)    Other Visit Diagnoses   None.     Meds ordered this encounter  Medications  . FLOVENT HFA 110 MCG/ACT inhaler  . albuterol (PROVENTIL HFA;VENTOLIN HFA) 108 (90 Base) MCG/ACT inhaler    Sig: Inhale 2 puffs into the lungs every 6 (six) hours as needed for wheezing or shortness of breath.    Dispense:  1 Inhaler    Refill:  11    Order Specific Question:   Supervising Provider    Answer:   Arlis Porta [phone]  . predniSONE (DELTASONE) 20 MG tablet    Sig: Take 2 tablets (40 mg total) by mouth daily with breakfast.    Dispense:  10 tablet    Refill:  0    Order Specific Question:    Supervising Provider    Answer:   Arlis Porta [phone]  . doxycycline (VIBRA-TABS) 100 MG tablet    Sig: Take 1 tablet (100 mg total) by mouth 2 (two) times daily.    Dispense:  14 tablet    Refill:  0    Order Specific Question:   Supervising Provider    Answer:   Arlis Porta [phone]  . benzonatate (TESSALON) 100 MG  capsule    Sig: Take 1 capsule (100 mg total) by mouth 3 (three) times daily as needed.    Dispense:  30 capsule    Refill:  0    Order Specific Question:   Supervising Provider    Answer:   Arlis Porta [phone]      Follow up plan: Return if symptoms worsen or fail to improve.

## 2016-05-07 NOTE — Assessment & Plan Note (Signed)
Treat for bronchitis today. Prednisone, albuterol, and doxy to cover for second worsening. Supportive care at home. CXR shows possible COPD- recommend spirometry at next visit. Recommend smoking cessation.

## 2016-05-07 NOTE — Patient Instructions (Signed)
You can use supportive care at home to help with your symptoms. I have sent Mucinex DM to your pharmacy to help break up the congestion and soothe your cough. You can takes this twice daily.  I have also sent tesslon perles to your pharmacy to help with the cough- you can take these 3 times daily as needed. Honey is a natural cough suppressant- so add it to your tea in the morning.  If you have a humidifer, set that up in your bedroom at night.   Please seek immediate medical attention if you develop shortness of breath not relieve by inhaler, chest pain/tightness, fever > 103 F or other concerning symptoms.   

## 2016-05-29 ENCOUNTER — Ambulatory Visit (INDEPENDENT_AMBULATORY_CARE_PROVIDER_SITE_OTHER): Payer: PPO | Admitting: *Deleted

## 2016-05-29 DIAGNOSIS — Z23 Encounter for immunization: Secondary | ICD-10-CM

## 2016-06-11 ENCOUNTER — Encounter: Payer: Self-pay | Admitting: Family Medicine

## 2016-06-11 ENCOUNTER — Ambulatory Visit (INDEPENDENT_AMBULATORY_CARE_PROVIDER_SITE_OTHER): Payer: PPO | Admitting: Family Medicine

## 2016-06-11 VITALS — BP 101/60 | HR 60 | Temp 98.1°F | Resp 16 | Ht 62.0 in | Wt 162.0 lb

## 2016-06-11 DIAGNOSIS — J40 Bronchitis, not specified as acute or chronic: Secondary | ICD-10-CM

## 2016-06-11 DIAGNOSIS — J432 Centrilobular emphysema: Secondary | ICD-10-CM | POA: Insufficient documentation

## 2016-06-11 DIAGNOSIS — J440 Chronic obstructive pulmonary disease with acute lower respiratory infection: Secondary | ICD-10-CM

## 2016-06-11 DIAGNOSIS — Z720 Tobacco use: Secondary | ICD-10-CM

## 2016-06-11 DIAGNOSIS — J449 Chronic obstructive pulmonary disease, unspecified: Secondary | ICD-10-CM

## 2016-06-11 MED ORDER — PREDNISONE 50 MG PO TABS: 50.0000 mg | ORAL_TABLET | Freq: Every day | ORAL | 0 refills | Status: DC

## 2016-06-11 NOTE — Assessment & Plan Note (Signed)
Suspected acute bronchitis, in setting of likely underlying mild COPD - Treat with prednisone burst - See A&P for COPD

## 2016-06-11 NOTE — Progress Notes (Signed)
Subjective:    Patient ID: Sarah Adkins, female    DOB: [birth year], 70 y.o.   MRN: [id number]  Sarah Adkins is a 70 y.o. female presenting on 06/11/2016 for Nasal Congestion (pt was here in 09/17 had bronchitis and got flu shot and Sx never resloved has nasal congestion runny nose HA Sunday low grade fever )  Patient presents for a same day appointment.  HPI  BRONCHITIS / Suspected COPD: - Recent history seen at [facility] 05/07/16 for same problem with URI then bronchitis, had CXR without pneumonia but showed increased air spaces concerning for emphysema, treated with prednisone burst, albuterol, and doxycycline. Patient reports mostly improved after 1 week of treatment, not quite 100%, eventually came back on 05/29/16 for flu shot, and then several days later after receiving vaccine started again with viral URI symptoms initially with clear rhinorrhea and non productive cough - Today presents with worsening bronchitis symptoms again over past 4 days, cough with productive sputum, thick green, also with nasal congestion - Has Albuterol inhaler and Flovent inhaler twice daily (has not used regularly, just used today) - Active smoker, cutting down see below - No prior formal dx of COPD in past, has never had spirometry or PFTs - Admits some mild temperature instability but no fever - Denies any active fever, chills, sweats, chest pain, dyspnea, hemoptysis, headache or sinus pressure, ear pain, sore throat  TOBACCO ABUSE: - Chronic tobacco history with cig smoking for 50 years avg 0.5ppd, recently has cut back down to 4 cigs a day and only partial smoking each cig, successful reduced smoking over past 45 days - No prior quit successful, has tried Chantix (could not tolerate with vivid dreams etc) - She plans to resume NRT patches and quit completely soon   Social History  Substance Use Topics  . Smoking status: Current Every Day Smoker    Packs/day: 0.25    Years: 50.00    Types: Cigarettes  .  Smokeless tobacco: Never Used     Comment: Prior failed Chantix. Currently cutting down < 4 cigs daily, will use NRT  . Alcohol use 0.0 oz/week     Comment: occasional beer    Review of Systems Per HPI unless specifically indicated above     Objective:    BP 101/60 (BP Location: Left Arm, Patient Position: Sitting, Cuff Size: Normal)   Pulse 60   Temp 98.1 F (36.7 C) (Oral)   Resp 16   Ht 5\' 2"  (1.575 m)   Wt 162 lb (73.5 kg)   SpO2 97%   BMI 29.63 kg/m   Wt Readings from Last 3 Encounters:  06/11/16 162 lb (73.5 kg)  05/07/16 160 lb (72.6 kg)  04/15/16 164 lb (74.4 kg)    Physical Exam  Constitutional: She is oriented to person, place, and time. She appears well-developed and well-nourished. No distress.  Well-appearing, comfortable, cooperative  HENT:  Head: Normocephalic and atraumatic.  Mouth/Throat: Oropharynx is clear and moist.  Frontal / maxillary sinuses non-tender. Nares patent with mild congestion without purulence or edema. Oropharynx clear without erythema, exudates, edema or asymmetry.  Eyes: Conjunctivae are normal. Right eye exhibits no discharge. Left eye exhibits no discharge.  Cardiovascular: Normal rate, regular rhythm, normal heart sounds and intact distal pulses.   No murmur heard. Pulmonary/Chest: Effort normal and breath sounds normal. No respiratory distress. She has no rales.  Occasional cough. Mild reduced generalized air movement with some mild coarse sounds without significant wheezing today.  Musculoskeletal: She exhibits  no edema.  Neurological: She is alert and oriented to person, place, and time.  Skin: Skin is warm and dry. No rash noted. She is not diaphoretic.  Nursing note and vitals reviewed.    I have personally reviewed the radiology report from Chest X-ray on 05/07/16.  EXAM: CHEST  2 VIEW  COMPARISON:  09/10/2015  FINDINGS: Biapical pleuroparenchymal scarring. Atherosclerotic calcification of the aortic arch. Borderline  enlargement of the cardiopericardial silhouette.  Multilevel thoracic spondylosis. Large lung volumes, suspicious for emphysema.  IMPRESSION: 1. Large lung volumes, suspicious for emphysema. 2. Atherosclerotic calcification of the aortic arch. 3. Borderline enlargement of the cardiopericardial silhouette.  I have personally reviewed the following lab results from 04/15/16.  Results for orders placed or performed in visit on 04/15/16  COMPLETE METABOLIC PANEL WITH GFR  Result Value Ref Range   Sodium 141 135 - 146 mmol/L   Potassium 3.7 3.5 - 5.3 mmol/L   Chloride 103 98 - 110 mmol/L   CO2 20 20 - 31 mmol/L   Glucose, Bld 97 65 - 99 mg/dL   BUN 20 7 - 25 mg/dL   Creat 0.70 0.50 - 0.99 mg/dL   Total Bilirubin 1.1 0.2 - 1.2 mg/dL   Alkaline Phosphatase 88 33 - 130 U/L   AST 19 10 - 35 U/L   ALT 15 6 - 29 U/L   Total Protein 6.4 6.1 - 8.1 g/dL   Albumin 4.2 3.6 - 5.1 g/dL   Calcium 9.7 8.6 - 10.4 mg/dL   GFR, Est African American >89 >=60 mL/min   GFR, Est Non African American 89 >=60 mL/min  Lipid Profile  Result Value Ref Range   Cholesterol 185 125 - 200 mg/dL   Triglycerides 118 <150 mg/dL   HDL 64 >=46 mg/dL   Total CHOL/HDL Ratio 2.9 <=5.0 Ratio   VLDL 24 <30 mg/dL   LDL Cholesterol 97 <130 mg/dL  TSH  Result Value Ref Range   TSH 1.15 mIU/L      Assessment & Plan:   Problem List Items Addressed This Visit    Tobacco abuse    Active smoker, ready to quit, tapering down on own now < 4 cigs daily Has NRT, advised to use and set quit date Smoking cessation counseling today      Chronic obstructive pulmonary disease with acute lower respiratory infection ([place]) - Primary    Consistent with mild acute exacerbation of COPD (consider new diagnosis, given chronic tobacco abuse >50 yr and emphysema changes on CXR), now with worsening productive cough. Similar to prior exacerbations, last 04/2016. - No hypoxia (97% on RA), afebrile, no recent hospitalization - Not  adhering to Flovent maintenance therapy - No formal Spirometry or PFTs  Plan: 1. Start Prednisone 50mg  x 5 day steroid burst 2. Use albuterol q 4 hr regularly x 2-3 days. 3. Resume Flovent maintenance inhaler BID every day 4. Considered antibiotics, however given recent course Doxy 1 month ago, afebrile and no hypoxia, would not be indicated in mild AECOPD, unless recurrent or not improved on steroids, advised to contact us within 48 hours if not improving, then would consider Levaquin 500mg  daily x 7 days 5. RTC about 1 week if not improving, otherwise strict return criteria to go to ED - Future schedule Spirometry within next few months when improved respiratory status       Relevant Medications   predniSONE (DELTASONE) 50 MG tablet   Bronchitis    Suspected acute bronchitis, in setting of likely underlying  mild COPD - Treat with prednisone burst - See A&P for COPD      Relevant Medications   predniSONE (DELTASONE) 50 MG tablet    Other Visit Diagnoses   None.     Meds ordered this encounter  Medications  . predniSONE (DELTASONE) 50 MG tablet    Sig: Take 1 tablet (50 mg total) by mouth daily with breakfast. For 5 days    Dispense:  5 tablet    Refill:  0      Follow up plan: Return in about 3 months (around 09/11/2016) for Spirometry PFTs.  Nobie Putnam, [facility] 06/11/2016, 11:28 AM

## 2016-06-11 NOTE — Assessment & Plan Note (Signed)
Consistent with mild acute exacerbation of COPD (consider new diagnosis, given chronic tobacco abuse >50 yr and emphysema changes on CXR), now with worsening productive cough. Similar to prior exacerbations, last 04/2016. - No hypoxia (97% on RA), afebrile, no recent hospitalization - Not adhering to Flovent maintenance therapy - No formal Spirometry or PFTs  Plan: 1. Start Prednisone 50mg  x 5 day steroid burst 2. Use albuterol q 4 hr regularly x 2-3 days. 3. Resume Flovent maintenance inhaler BID every day 4. Considered antibiotics, however given recent course Doxy 1 month ago, afebrile and no hypoxia, would not be indicated in mild AECOPD, unless recurrent or not improved on steroids, advised to contact us within 48 hours if not improving, then would consider Levaquin 500mg  daily x 7 days 5. RTC about 1 week if not improving, otherwise strict return criteria to go to ED - Future schedule Spirometry within next few months when improved respiratory status

## 2016-06-11 NOTE — Patient Instructions (Signed)
Thank you for coming in to clinic today.  1. It sounds like you had an Upper Respiratory Virus that has settled into a Bronchitis again, lower respiratory tract infection. I don't have concerns for pneumonia today, and think that this should gradually improve. 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 - Start Prednisone 50mg  daily for next 5 days - this will open up lungs allow you to breath better and treat that wheezing or bronchospasm - Use Albuterol inhaler 2 puffs every 4-6 hours around the clock for next 2-3 days, max up to 5 days then use as needed  - Use Flovent inhaler maintenance therapy twice a day every day, will likely need for months to year or longer - IF not improving after 48 hours, or develop worsening fever or worsening cough, then notify us and we will consider a different antibiotic to add on  - Start OTC Mucinex for 1 week or less, to help clear the mucus - Use nasal saline (Simply Saline or Ocean Spray) to flush nasal congestion multiple times a day, may help cough - Drink plenty of fluids to improve congestion  Please schedule an appointment for Spirometry (Pulmonary Function Testing) once you are feeling better, you are scheduled for a 6 month follow-up with Dr Luan Pulling in march, anytime before this appointment is reasonable to do the Spirometry.  If you have any other questions or concerns, please feel free to call the clinic or send a message through [place]. You may also schedule an earlier appointment if necessary.  Nobie Putnam, DO [place]

## 2016-06-11 NOTE — Assessment & Plan Note (Signed)
Active smoker, ready to quit, tapering down on own now < 4 cigs daily Has NRT, advised to use and set quit date Smoking cessation counseling today

## 2016-06-27 DIAGNOSIS — D1801 Hemangioma of skin and subcutaneous tissue: Secondary | ICD-10-CM | POA: Diagnosis not present

## 2016-06-27 DIAGNOSIS — C44519 Basal cell carcinoma of skin of other part of trunk: Secondary | ICD-10-CM | POA: Diagnosis not present

## 2016-06-27 DIAGNOSIS — L821 Other seborrheic keratosis: Secondary | ICD-10-CM | POA: Diagnosis not present

## 2016-06-27 DIAGNOSIS — L538 Other specified erythematous conditions: Secondary | ICD-10-CM | POA: Diagnosis not present

## 2016-06-27 DIAGNOSIS — L820 Inflamed seborrheic keratosis: Secondary | ICD-10-CM | POA: Diagnosis not present

## 2016-07-17 DIAGNOSIS — C44519 Basal cell carcinoma of skin of other part of trunk: Secondary | ICD-10-CM | POA: Diagnosis not present

## 2016-10-13 ENCOUNTER — Ambulatory Visit (INDEPENDENT_AMBULATORY_CARE_PROVIDER_SITE_OTHER): Payer: PPO | Admitting: Family Medicine

## 2016-10-13 ENCOUNTER — Encounter: Payer: Self-pay | Admitting: Family Medicine

## 2016-10-13 ENCOUNTER — Other Ambulatory Visit: Payer: Self-pay | Admitting: Family Medicine

## 2016-10-13 VITALS — BP 140/80 | HR 56 | Temp 97.8°F | Resp 16 | Ht 62.0 in | Wt 169.0 lb

## 2016-10-13 DIAGNOSIS — E784 Other hyperlipidemia: Secondary | ICD-10-CM

## 2016-10-13 DIAGNOSIS — E7849 Other hyperlipidemia: Secondary | ICD-10-CM

## 2016-10-13 DIAGNOSIS — E034 Atrophy of thyroid (acquired): Secondary | ICD-10-CM | POA: Diagnosis not present

## 2016-10-13 DIAGNOSIS — J440 Chronic obstructive pulmonary disease with acute lower respiratory infection: Secondary | ICD-10-CM

## 2016-10-13 DIAGNOSIS — Z23 Encounter for immunization: Secondary | ICD-10-CM

## 2016-10-13 DIAGNOSIS — B0229 Other postherpetic nervous system involvement: Secondary | ICD-10-CM | POA: Diagnosis not present

## 2016-10-13 DIAGNOSIS — I10 Essential (primary) hypertension: Secondary | ICD-10-CM

## 2016-10-13 DIAGNOSIS — J40 Bronchitis, not specified as acute or chronic: Secondary | ICD-10-CM

## 2016-10-13 LAB — LIPID PANEL
CHOLESTEROL: 191 mg/dL (ref <200)
HDL: 60 mg/dL (ref >50)
LDL Cholesterol: 105 mg/dL — ABNORMAL HIGH (ref <100)
Total CHOL/HDL Ratio: 3.2 Ratio (ref <5.0)
Triglycerides: 129 mg/dL (ref <150)
VLDL: 26 mg/dL (ref <30)

## 2016-10-13 LAB — COMPLETE METABOLIC PANEL WITH GFR
ALT: 28 U/L (ref 6–29)
AST: 25 U/L (ref 10–35)
Albumin: 4.1 g/dL (ref 3.6–5.1)
Alkaline Phosphatase: 102 U/L (ref 33–130)
BILIRUBIN TOTAL: 0.9 mg/dL (ref 0.2–1.2)
BUN: 20 mg/dL (ref 7–25)
CHLORIDE: 106 mmol/L (ref 98–110)
CO2: 26 mmol/L (ref 20–31)
CREATININE: 0.63 mg/dL (ref 0.50–0.99)
Calcium: 9.6 mg/dL (ref 8.6–10.4)
Glucose, Bld: 97 mg/dL (ref 65–99)
Potassium: 4.2 mmol/L (ref 3.5–5.3)
Sodium: 140 mmol/L (ref 135–146)
Total Protein: 6.1 g/dL (ref 6.1–8.1)

## 2016-10-13 LAB — CBC WITH DIFFERENTIAL/PLATELET
BASOS PCT: 1 %
Basophils Absolute: 78 cells/uL (ref 0–200)
Eosinophils Absolute: 78 cells/uL (ref 15–500)
Eosinophils Relative: 1 %
HCT: 46.6 % — ABNORMAL HIGH (ref 35.0–45.0)
Hemoglobin: 15.3 g/dL (ref 11.7–15.5)
LYMPHS PCT: 23 %
Lymphs Abs: 1794 cells/uL (ref 850–3900)
MCH: 29.2 pg (ref 27.0–33.0)
MCHC: 32.8 g/dL (ref 32.0–36.0)
MCV: 88.9 fL (ref 80.0–100.0)
MONO ABS: 624 {cells}/uL (ref 200–950)
MPV: 10.3 fL (ref 7.5–12.5)
Monocytes Relative: 8 %
NEUTROS ABS: 5226 {cells}/uL (ref 1500–7800)
Neutrophils Relative %: 67 %
PLATELETS: 214 10*3/uL (ref 140–400)
RBC: 5.24 MIL/uL — AB (ref 3.80–5.10)
RDW: 13.5 % (ref 11.0–15.0)
WBC: 7.8 10*3/uL (ref 3.8–10.8)

## 2016-10-13 LAB — TSH: TSH: 1.71 mIU/L

## 2016-10-13 NOTE — Progress Notes (Signed)
Name: Sarah Adkins   MRN: [id number]    DOB: 20-Jan-1946   Date:10/13/2016       Progress Note  Subjective  Chief Complaint  Chief Complaint  Patient presents with  . Hypertension    HPI Here for f/u of HBP, hypothyroidism, elevated lipids, asthma.  She takes all meds.  She has no c/o.   No problem-specific Assessment & Plan notes found for this encounter.   Past Medical History:  Diagnosis Date  . Hypertension   . Hyperthyroidism     Past Surgical History:  Procedure Laterality Date  . FOOT SURGERY Left   . HAND SURGERY Bilateral   . LAPAROSCOPIC TUBAL LIGATION      Family History  Problem Relation Age of Onset  . Stroke Mother   . Heart attack Mother   . Hypertension Mother   . Congestive Heart Failure Father   . Seizures Brother     Social History   Social History  . Marital status: Married    Spouse name: N/A  . Number of children: N/A  . Years of education: N/A   Occupational History  . Not on file.   Social History Main Topics  . Smoking status: Current Every Day Smoker    Packs/day: 0.25    Years: 50.00    Types: Cigarettes  . Smokeless tobacco: Never Used     Comment: Prior failed Chantix. Currently cutting down < 4 cigs daily, will use NRT  . Alcohol use 0.0 oz/week     Comment: occasional beer  . Drug use: No  . Sexual activity: Not on file   Other Topics Concern  . Not on file   Social History Narrative  . No narrative on file     Current Outpatient Prescriptions:  .  albuterol (PROVENTIL HFA;VENTOLIN HFA) 108 (90 Base) MCG/ACT inhaler, Inhale 2 puffs into the lungs every 6 (six) hours as needed for wheezing or shortness of breath., Disp: 1 Inhaler, Rfl: 11 .  aspirin EC 325 MG tablet, Take 325 mg by mouth daily., Disp: , Rfl:  .  atorvastatin (LIPITOR) 10 MG tablet, Take 1 tablet (10 mg total) by mouth at bedtime., Disp: 90 tablet, Rfl: 3 .  Cholecalciferol (VITAMIN D3) 1000 units CAPS, Take 1,000 Units by mouth daily., Disp: , Rfl:   .  FLOVENT HFA 110 MCG/ACT inhaler, , Disp: , Rfl:  .  gabapentin (NEURONTIN) 100 MG capsule, Take 2 caps three times a day, Disp: 540 capsule, Rfl: 3 .  hydrochlorothiazide (HYDRODIURIL) 12.5 MG tablet, Take 1 tablet (12.5 mg total) by mouth daily., Disp: 90 tablet, Rfl: 3 .  levothyroxine (SYNTHROID) 100 MCG tablet, TAKE 1 TABLET BY MOUTH EVERY MORNING, except omit on Sunday, Disp: 90 tablet, Rfl: 3 .  lisinopril (PRINIVIL,ZESTRIL) 20 MG tablet, Take 1 tablet (20 mg total) by mouth daily., Disp: 90 tablet, Rfl: 3 .  metoprolol (LOPRESSOR) 50 MG tablet, Take 1 tablet (50 mg total) by mouth 2 (two) times daily., Disp: 180 tablet, Rfl: 3  Allergies  Allergen Reactions  . Erythromycin Hives     Review of Systems  Constitutional: Negative for chills, fever, malaise/fatigue and weight loss.  HENT: Negative for hearing loss and tinnitus.   Eyes: Negative for blurred vision and double vision.  Respiratory: Negative for cough, shortness of breath and wheezing.   Cardiovascular: Negative for chest pain, palpitations and leg swelling.  Gastrointestinal: Negative for abdominal pain, blood in stool and heartburn.  Genitourinary: Negative for dysuria, frequency and  urgency.  Musculoskeletal: Negative for joint pain, myalgias and neck pain.  Skin: Negative for itching and rash.  Neurological: Negative for dizziness, tingling, tremors, weakness and headaches.      Objective  Vitals:   10/13/16 0822 10/13/16 0904  BP: (!) 141/66 140/80  Pulse: (!) 55 (!) 56  Resp: 16   Temp: 97.8 F (36.6 C)   TempSrc: Oral   Weight: 169 lb (76.7 kg)   Height: 5\' 2"  (1.575 m)     Physical Exam  Constitutional: She is oriented to person, place, and time and well-developed, well-nourished, and in no distress. No distress.  HENT:  Head: Normocephalic and atraumatic.  Eyes: Conjunctivae and EOM are normal. Pupils are equal, round, and reactive to light. No scleral icterus.  Neck: Normal range of motion.  Carotid bruit is not present. No thyromegaly present.  Cardiovascular: Normal rate, regular rhythm and normal heart sounds.  Exam reveals no gallop and no friction rub.   No murmur heard. Pulmonary/Chest: Effort normal and breath sounds normal. No respiratory distress. She has no wheezes. She has no rales.  Abdominal: Soft. Bowel sounds are normal. She exhibits no distension and no mass. There is no tenderness.  Musculoskeletal: She exhibits no edema.  Lymphadenopathy:    She has no cervical adenopathy.  Neurological: She is alert and oriented to person, place, and time.  Vitals reviewed.      No results found for this or any previous visit (from the past 2160 hour(s)).   Assessment & Plan  Problem List Items Addressed This Visit      Cardiovascular and Mediastinum   HBP (high blood pressure)   Relevant Orders   COMPLETE METABOLIC PANEL WITH GFR     Respiratory   Chronic obstructive pulmonary disease with acute lower respiratory infection ([place])   Relevant Orders   CBC with Differential     Endocrine   Hypothyroidism   Relevant Orders   TSH     Nervous and Auditory   Post herpetic neuralgia     Other   Hyperlipidemia   Relevant Orders   Lipid Profile    Other Visit Diagnoses    Need for vaccination    -  Primary   Relevant Orders   Pneumococcal polysaccharide vaccine 23-valent greater than or equal to 2yo subcutaneous/IM (Completed)   Tdap vaccine greater than or equal to 7yo IM (Completed)      No orders of the defined types were placed in this encounter.  1. Need for vaccination  - Pneumococcal polysaccharide vaccine 23-valent greater than or equal to 2yo subcutaneous/IM - Tdap vaccine greater than or equal to 7yo IM  2. Essential hypertension Cont HCTZ, Metoprolol, Lisiniopril - COMPLETE METABOLIC PANEL WITH GFR  3. Chronic obstructive pulmonary disease with acute lower respiratory infection ([facility]) Cont Flovent and Albuterol - CBC with  Differential  4. Hypothyroidism due to acquired atrophy of thyroid Cont  Levothyroxine - TSH  5. Post herpetic neuralgia Cont Gabapentin  6. Other hyperlipidemia Cont Lipitor - Lipid Profile

## 2016-10-16 DIAGNOSIS — Z85828 Personal history of other malignant neoplasm of skin: Secondary | ICD-10-CM | POA: Diagnosis not present

## 2016-10-16 DIAGNOSIS — D1801 Hemangioma of skin and subcutaneous tissue: Secondary | ICD-10-CM | POA: Diagnosis not present

## 2016-10-16 DIAGNOSIS — Z08 Encounter for follow-up examination after completed treatment for malignant neoplasm: Secondary | ICD-10-CM | POA: Diagnosis not present

## 2016-10-16 DIAGNOSIS — L821 Other seborrheic keratosis: Secondary | ICD-10-CM | POA: Diagnosis not present

## 2017-02-03 ENCOUNTER — Telehealth: Payer: Self-pay | Admitting: Family Medicine

## 2017-02-03 NOTE — Telephone Encounter (Signed)
Called pt to schedule Annual Wellness Visit with NHA  - knb  °

## 2017-02-12 ENCOUNTER — Other Ambulatory Visit: Payer: Self-pay

## 2017-02-12 DIAGNOSIS — E034 Atrophy of thyroid (acquired): Secondary | ICD-10-CM

## 2017-02-12 DIAGNOSIS — B0229 Other postherpetic nervous system involvement: Secondary | ICD-10-CM

## 2017-02-12 DIAGNOSIS — I10 Essential (primary) hypertension: Secondary | ICD-10-CM

## 2017-02-12 MED ORDER — METOPROLOL TARTRATE 50 MG PO TABS: 50.0000 mg | ORAL_TABLET | Freq: Two times a day (BID) | ORAL | 1 refills | Status: DC

## 2017-02-12 MED ORDER — LEVOTHYROXINE SODIUM 100 MCG PO TABS: ORAL_TABLET | ORAL | 1 refills | Status: DC

## 2017-02-12 MED ORDER — LISINOPRIL 20 MG PO TABS: 20.0000 mg | ORAL_TABLET | Freq: Every day | ORAL | 1 refills | Status: DC

## 2017-02-12 MED ORDER — GABAPENTIN 100 MG PO CAPS: ORAL_CAPSULE | ORAL | 1 refills | Status: DC

## 2017-02-12 MED ORDER — HYDROCHLOROTHIAZIDE 12.5 MG PO TABS: 12.5000 mg | ORAL_TABLET | Freq: Every day | ORAL | 1 refills | Status: DC

## 2017-03-10 ENCOUNTER — Telehealth: Payer: Self-pay | Admitting: Family Medicine

## 2017-03-10 NOTE — Telephone Encounter (Signed)
Called pt to schedule for Annual Wellness Visit with Nurse Health Advisor, Tiffany Hill, my c/b # is [phone]  [name] ° °

## 2017-04-08 ENCOUNTER — Telehealth: Payer: Self-pay | Admitting: Family Medicine

## 2017-04-08 NOTE — Telephone Encounter (Signed)
Called pt to schedule for Annual Wellness Visit with Nurse Health Advisor, Tiffany Hill, my c/b # is [phone]  [name] ° °

## 2017-04-22 ENCOUNTER — Encounter: Payer: Self-pay | Admitting: Family Medicine

## 2017-04-22 ENCOUNTER — Ambulatory Visit (INDEPENDENT_AMBULATORY_CARE_PROVIDER_SITE_OTHER): Payer: PPO | Admitting: Family Medicine

## 2017-04-22 ENCOUNTER — Other Ambulatory Visit: Payer: Self-pay | Admitting: Family Medicine

## 2017-04-22 VITALS — BP 109/60 | HR 63 | Temp 98.0°F | Resp 16 | Ht 62.0 in | Wt 174.0 lb

## 2017-04-22 DIAGNOSIS — Z23 Encounter for immunization: Secondary | ICD-10-CM

## 2017-04-22 DIAGNOSIS — R7309 Other abnormal glucose: Secondary | ICD-10-CM

## 2017-04-22 DIAGNOSIS — Z1159 Encounter for screening for other viral diseases: Secondary | ICD-10-CM | POA: Diagnosis not present

## 2017-04-22 DIAGNOSIS — E034 Atrophy of thyroid (acquired): Secondary | ICD-10-CM | POA: Diagnosis not present

## 2017-04-22 DIAGNOSIS — Z1239 Encounter for other screening for malignant neoplasm of breast: Secondary | ICD-10-CM

## 2017-04-22 DIAGNOSIS — I10 Essential (primary) hypertension: Secondary | ICD-10-CM | POA: Diagnosis not present

## 2017-04-22 DIAGNOSIS — Z1231 Encounter for screening mammogram for malignant neoplasm of breast: Secondary | ICD-10-CM | POA: Diagnosis not present

## 2017-04-22 DIAGNOSIS — E782 Mixed hyperlipidemia: Secondary | ICD-10-CM

## 2017-04-22 DIAGNOSIS — J432 Centrilobular emphysema: Secondary | ICD-10-CM

## 2017-04-22 DIAGNOSIS — J40 Bronchitis, not specified as acute or chronic: Secondary | ICD-10-CM | POA: Diagnosis not present

## 2017-04-22 DIAGNOSIS — E7849 Other hyperlipidemia: Secondary | ICD-10-CM

## 2017-04-22 MED ORDER — ATORVASTATIN CALCIUM 10 MG PO TABS: 10.0000 mg | ORAL_TABLET | Freq: Every day | ORAL | 3 refills | Status: DC

## 2017-04-22 MED ORDER — SYNTHROID 100 MCG PO TABS: ORAL_TABLET | ORAL | 2 refills | Status: DC

## 2017-04-22 MED ORDER — ALBUTEROL SULFATE HFA 108 (90 BASE) MCG/ACT IN AERS: 2.0000 | INHALATION_SPRAY | Freq: Four times a day (QID) | RESPIRATORY_TRACT | 11 refills | Status: DC | PRN

## 2017-04-22 NOTE — Assessment & Plan Note (Signed)
Possible hypothyroidism symptoms now as reported, may be related to change over past few months of generic instead of brand name or can be dosing issue with not given on Sundays - Last TSH normal, 1.71 (10/2016)  Plan: 1. Check TSH, Free T4 today 2. Switched rx from generic to brand name Synthroid 193mcg daily, except Sundays - if thyroid labs are normal, then still may be related to generic, so if still not improved then can try half tab on sundays or eventually whole tab on sundays 3. Encouraged other lifestyle interventions for diet/exercise for weight 4. Follow-up 6 months, will re-check thyroid function and other labs as well

## 2017-04-22 NOTE — Assessment & Plan Note (Signed)
Well-controlled HTN - now concern possible mild low BP - Home BP readings - none available  No known complications   Plan:  1. Continue current BP regimen - HCTZ 12.5mg  daily, Lisinopril 20mg  daily, Metoprolol 50mg  BID - Regarding recall with HCTZ checked her pills today, these are normal, NOT spironolactone pills. She is unaffected by this recall. However if she would like to reduce medicines if still low BP she may try HOLDING HCTZ for now to see how BP is affected 2. Encourage improved lifestyle - low sodium diet, regular exercise, smoking cessation 3. Start monitor BP outside office, bring readings to next visit, if persistently >140/90 or new symptoms notify office sooner 4. Follow-up 6 months - adjust BP meds

## 2017-04-22 NOTE — Patient Instructions (Addendum)
Thank you for coming to the clinic today.  1. Check BP occasionally, write down readings - If LOW < 100 / 60, and or symptomatic, then you can HOLD the Hydrochlorothiazide 12.5mg  for now, and take others - let me know if questions  Medications - SWITCHED levothyroxine to brand name Synthroid - If cannot get brand name, maybe try either add HALF pill on Sundays or work up to a whole pill on Sunday  Check blood today - will send results to you once available  Check Hep C screening  Check A1c - diabetes screening   For Mammogram screening for breast cancer   Call the [place] below anytime to schedule your own appointment now that order has been placed.  [facility] [place], [place] [address] Phone: [phone]  DUE for FASTING BLOOD WORK (no food or drink after midnight before the lab appointment, only water or coffee without cream/sugar on the morning of)  SCHEDULE "Lab Only" visit in the morning at the clinic for lab draw in 6 MONTHS  - Make sure Lab Only appointment is at about 1 week before your next appointment, so that results will be available  For Lab Results, once available within 2-3 days of blood draw, you can can log in to MyChart online to view your results and a brief explanation. Also, we can discuss results at next follow-up visit.   Please schedule a Follow-up Appointment to: Return in about 6 months (around 10/20/2017) for Medicare Physical CPE.  If you have any other questions or concerns, please feel free to call the clinic or send a message through [place]. You may also schedule an earlier appointment if necessary.  Additionally, you may be receiving a survey about your experience at our clinic within a few days to 1 week by e-mail or mail. We value your feedback.  Nobie Putnam, DO [place]

## 2017-04-22 NOTE — Assessment & Plan Note (Signed)
Refilled Atorvastatin 10mg  nightly

## 2017-04-22 NOTE — Progress Notes (Signed)
Subjective:    Patient ID: Sarah Adkins, female    DOB: 07/06/1946, 71 y.o.   MRN: [id number]  Sarah Adkins is a 71 y.o. female presenting on 04/22/2017 for Hypertension   HPI   Hypothyroidism / WEIGHT GAIN - Reports increased activity recently but unable to lose weight. She is concerned that generic vs brand name thyroid medicine, now on generic for last 2, 90 day bottles. - In past she was reduced on Levothyroxine from 131mg daily to only 6 days, not on Sundays - Last TSH 10/2016 normal TSH - Admits some weight gain recently, difficulty with wt loss - History of mildly elevated glucose but no prior A1c for comparison. Fam history of mother with DM in age 2116s- Denies any hair loss or brittle hair, temperature imbalance  CHRONIC HTN Reports not checking BP at home. Feels BP is now lower recently, since retired and less stress. Current Meds - HCTZ 12.566m(did not take today due to recent recall with SpArlyce Harman Lisinopril 2069maily, Metoprolol 69m79mD Reports good compliance, did not take meds today. Tolerating well, w/o complaints. 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 Denies CP, dyspnea, HA, edema, dizziness / lightheadedness  TOBACCO ABUSE: - Chronic tobacco history with cig smoking for 50 years avg 0.5ppd, remains on lower amount with cut back down to 4 cigs a day and only partial smoking each cig - No prior quit successful, has tried Chantix (could not tolerate with vivid dreams etc) - Not successful with NRT - Not ready to quit at this time  Health Maintenance: - Due Flu Shot, will receive today - [facility] biopsy was negative, repeated every 6-12 months for while, no abnormal since. Fam history 1st cousin in age 40s 6sh breast cancer. Currently no symptoms or breast abnormality.  Depression screen PHQ [facility]9 04/22/2017 04/15/2016 02/05/2016  Decreased Interest 0 0 0  Down, Depressed, Hopeless 0 0 0  PHQ - 2 Score 0 0 0    Social History  Substance Use Topics  . Smoking status: Current Every Day Smoker    Packs/day: 0.25    Years: 50.00    Types: Cigarettes  . Smokeless tobacco: Current User     Comment: Prior failed Chantix. Currently cutting down < 4 cigs daily, will use NRT  . Alcohol use 0.0 oz/week     Comment: occasional beer    Review of Systems Per HPI unless specifically indicated above     Objective:    BP 109/60   Pulse 63   Temp 98 F (36.7 C) (Oral)   Resp 16   Ht _0  (1.575 m)   Wt 174 lb (78.9 kg)   BMI 31.83 kg/m   Wt Readings from Last 3 Encounters:  04/22/17 174 lb (78.9 kg)  10/13/16 169 lb (76.7 kg)  06/11/16 162 lb (73.5 kg)    Physical Exam  Constitutional: She is oriented to person, place, and time. She appears well-developed and well-nourished. No distress.  Well-appearing, comfortable, cooperative  HENT:  Head: Normocephalic and atraumatic.  Mouth/Throat: Oropharynx is clear and moist.  Eyes: Conjunctivae are normal. Right eye exhibits no discharge. Left eye exhibits no discharge.  Neck: Normal range of motion. Neck supple. No thyromegaly present.  Cardiovascular: Normal rate, regular rhythm, normal heart sounds and intact distal pulses.   No murmur  heard. Pulmonary/Chest: Effort normal and breath sounds normal. No respiratory distress. She has no wheezes. She has no rales.  Musculoskeletal: Normal range of motion. She exhibits no edema.  Lymphadenopathy:    She has no cervical adenopathy.  Neurological: She is alert and oriented to person, place, and time.  Skin: Skin is warm and dry. No rash noted. She is not diaphoretic. No erythema.  Psychiatric: She has a normal mood and affect. Her behavior is normal.  Well groomed, good eye contact, normal speech and thoughts  Nursing note and vitals reviewed.    Results for orders placed or  performed in visit on 10/13/16  COMPLETE METABOLIC PANEL WITH GFR  Result Value Ref Range   Sodium 140 135 - 146 mmol/L   Potassium 4.2 3.5 - 5.3 mmol/L   Chloride 106 98 - 110 mmol/L   CO2 26 20 - 31 mmol/L   Glucose, Bld 97 65 - 99 mg/dL   BUN 20 7 - 25 mg/dL   Creat 0.63 0.50 - 0.99 mg/dL   Total Bilirubin 0.9 0.2 - 1.2 mg/dL   Alkaline Phosphatase 102 33 - 130 U/L   AST 25 10 - 35 U/L   ALT 28 6 - 29 U/L   Total Protein 6.1 6.1 - 8.1 g/dL   Albumin 4.1 3.6 - 5.1 g/dL   Calcium 9.6 8.6 - 10.4 mg/dL   GFR, Est African American >89 >=60 mL/min   GFR, Est Non African American >89 >=60 mL/min  CBC with Differential  Result Value Ref Range   WBC 7.8 3.8 - 10.8 K/uL   RBC 5.24 (H) 3.80 - 5.10 MIL/uL   Hemoglobin 15.3 11.7 - 15.5 g/dL   HCT 46.6 (H) 35.0 - 45.0 %   MCV 88.9 80.0 - 100.0 fL   MCH 29.2 27.0 - 33.0 pg   MCHC 32.8 32.0 - 36.0 g/dL   RDW 13.5 11.0 - 15.0 %   Platelets 214 140 - 400 K/uL   MPV 10.3 7.5 - 12.5 fL   Neutro Abs 5,226 1,500 - 7,800 cells/uL   Lymphs Abs 1,794 850 - 3,900 cells/uL   Monocytes Absolute 624 200 - 950 cells/uL   Eosinophils Absolute 78 15 - 500 cells/uL   Basophils Absolute 78 0 - 200 cells/uL   Neutrophils Relative % 67 %   Lymphocytes Relative 23 %   Monocytes Relative 8 %   Eosinophils Relative 1 %   Basophils Relative 1 %   Smear Review Criteria for review not met   Lipid Profile  Result Value Ref Range   Cholesterol 191 <200 mg/dL   Triglycerides 129 <150 mg/dL   HDL 60 >50 mg/dL   Total CHOL/HDL Ratio 3.2 <5.0 Ratio   VLDL 26 <30 mg/dL   LDL Cholesterol 105 (H) <100 mg/dL  TSH  Result Value Ref Range   TSH 1.71 mIU/L      Assessment & Plan:   Problem List Items Addressed This Visit    Hypothyroidism - Primary    Possible hypothyroidism symptoms now as reported, may be related to change over past few months of generic instead of brand name or can be dosing issue with not given on Sundays - Last TSH normal, 1.71  (10/2016)  Plan: 1. Check TSH, Free T4 today 2. Switched rx from generic to brand name Synthroid 140mg daily, except Sundays - if thyroid labs are normal, then still may be related to generic, so if still not improved then can try half tab  on sundays or eventually whole tab on sundays 3. Encouraged other lifestyle interventions for diet/exercise for weight 4. Follow-up 6 months, will re-check thyroid function and other labs as well      Relevant Medications   SYNTHROID 100 MCG tablet   Other Relevant Orders   TSH   T4, free   Hyperlipidemia    Refilled Atorvastatin 71m nightly      Relevant Medications   atorvastatin (LIPITOR) 10 MG tablet   Essential hypertension    Well-controlled HTN - now concern possible mild low BP - Home BP readings - none available  No known complications   Plan:  1. Continue current BP regimen - HCTZ 12.550mdaily, Lisinopril 2081maily, Metoprolol 50m48mD - Regarding recall with HCTZ checked her pills today, these are normal, NOT spironolactone pills. She is unaffected by this recall. However if she would like to reduce medicines if still low BP she may try HOLDING HCTZ for now to see how BP is affected 2. Encourage improved lifestyle - low sodium diet, regular exercise, smoking cessation 3. Start monitor BP outside office, bring readings to next visit, if persistently >140/90 or new symptoms notify office sooner 4. Follow-up 6 months - adjust BP meds      Relevant Medications   atorvastatin (LIPITOR) 10 MG tablet    Other Visit Diagnoses    Abnormal glucose       Prior elevated glucose reported, no prior A1c, will check with labs given wt gain   Relevant Orders   Hemoglobin A1c   Needs flu shot       Relevant Orders   Flu vaccine HIGH DOSE PF (Fluzone High dose) (Completed)   Bronchitis       History of bronchospasm with bronchitis and concern for possible COPD, requesting refill Albuterol PRN   Relevant Medications   albuterol (PROVENTIL  HFA;VENTOLIN HFA) 108 (90 Base) MCG/ACT inhaler   Screening for breast cancer       [facility] to call when ready, will need to sign release and request records   Relevant Orders   MM DIGITAL SCREENING BILATERAL   Need for hepatitis C screening test       Relevant Orders   Hepatitis C antibody      Meds ordered this encounter  Medications  . SYNTHROID 100 MCG tablet    Sig: TAKE 1 TABLET BY MOUTH EVERY MORNING, except omit on Sunday    Dispense:  90 tablet    Refill:  2    Please dispense brand name Synthroid only.  . alMarland Kitchenuterol (PROVENTIL HFA;VENTOLIN HFA) 108 (90 Base) MCG/ACT inhaler    Sig: Inhale 2 puffs into the lungs every 6 (six) hours as needed for wheezing or shortness of breath.    Dispense:  1 Inhaler    Refill:  11  . atorvastatin (LIPITOR) 10 MG tablet    Sig: Take 1 tablet (10 mg total) by mouth at bedtime.    Dispense:  90 tablet    Refill:  3    Follow up plan: Return in about 6 months (around 10/20/2017) for Medicare Physical CPE.  AlexNobie Putnam [facility] 04/22/2017, 2:41 PM

## 2017-04-23 LAB — TSH: TSH: 1.91 mIU/L (ref 0.40–4.50)

## 2017-04-23 LAB — HEMOGLOBIN A1C
HEMOGLOBIN A1C: 5.6 %{Hb} (ref <5.7)
MEAN PLASMA GLUCOSE: 114 (calc)
eAG (mmol/L): 6.3 (calc)

## 2017-04-23 LAB — HEPATITIS C ANTIBODY
HEP C AB: NONREACTIVE
SIGNAL TO CUT-OFF: 0.01 (ref <1.00)

## 2017-04-23 LAB — T4, FREE: Free T4: 1.6 ng/dL (ref 0.8–1.8)

## 2017-06-18 DIAGNOSIS — Z85828 Personal history of other malignant neoplasm of skin: Secondary | ICD-10-CM | POA: Diagnosis not present

## 2017-06-18 DIAGNOSIS — D2272 Melanocytic nevi of left lower limb, including hip: Secondary | ICD-10-CM | POA: Diagnosis not present

## 2017-06-18 DIAGNOSIS — L821 Other seborrheic keratosis: Secondary | ICD-10-CM | POA: Diagnosis not present

## 2017-06-18 DIAGNOSIS — D0439 Carcinoma in situ of skin of other parts of face: Secondary | ICD-10-CM | POA: Diagnosis not present

## 2017-06-18 DIAGNOSIS — D2261 Melanocytic nevi of right upper limb, including shoulder: Secondary | ICD-10-CM | POA: Diagnosis not present

## 2017-06-18 DIAGNOSIS — D485 Neoplasm of uncertain behavior of skin: Secondary | ICD-10-CM | POA: Diagnosis not present

## 2017-07-28 IMAGING — DX DG CHEST 2V
2 series · 2 of 2 positions shown · non-contrast
Comparison: 09/10/2015

CLINICAL DATA: Productive cough for the past week. Congestion.
Chest tightness. Shortness of breath.

EXAM:
CHEST  2 VIEW

[chest pa]
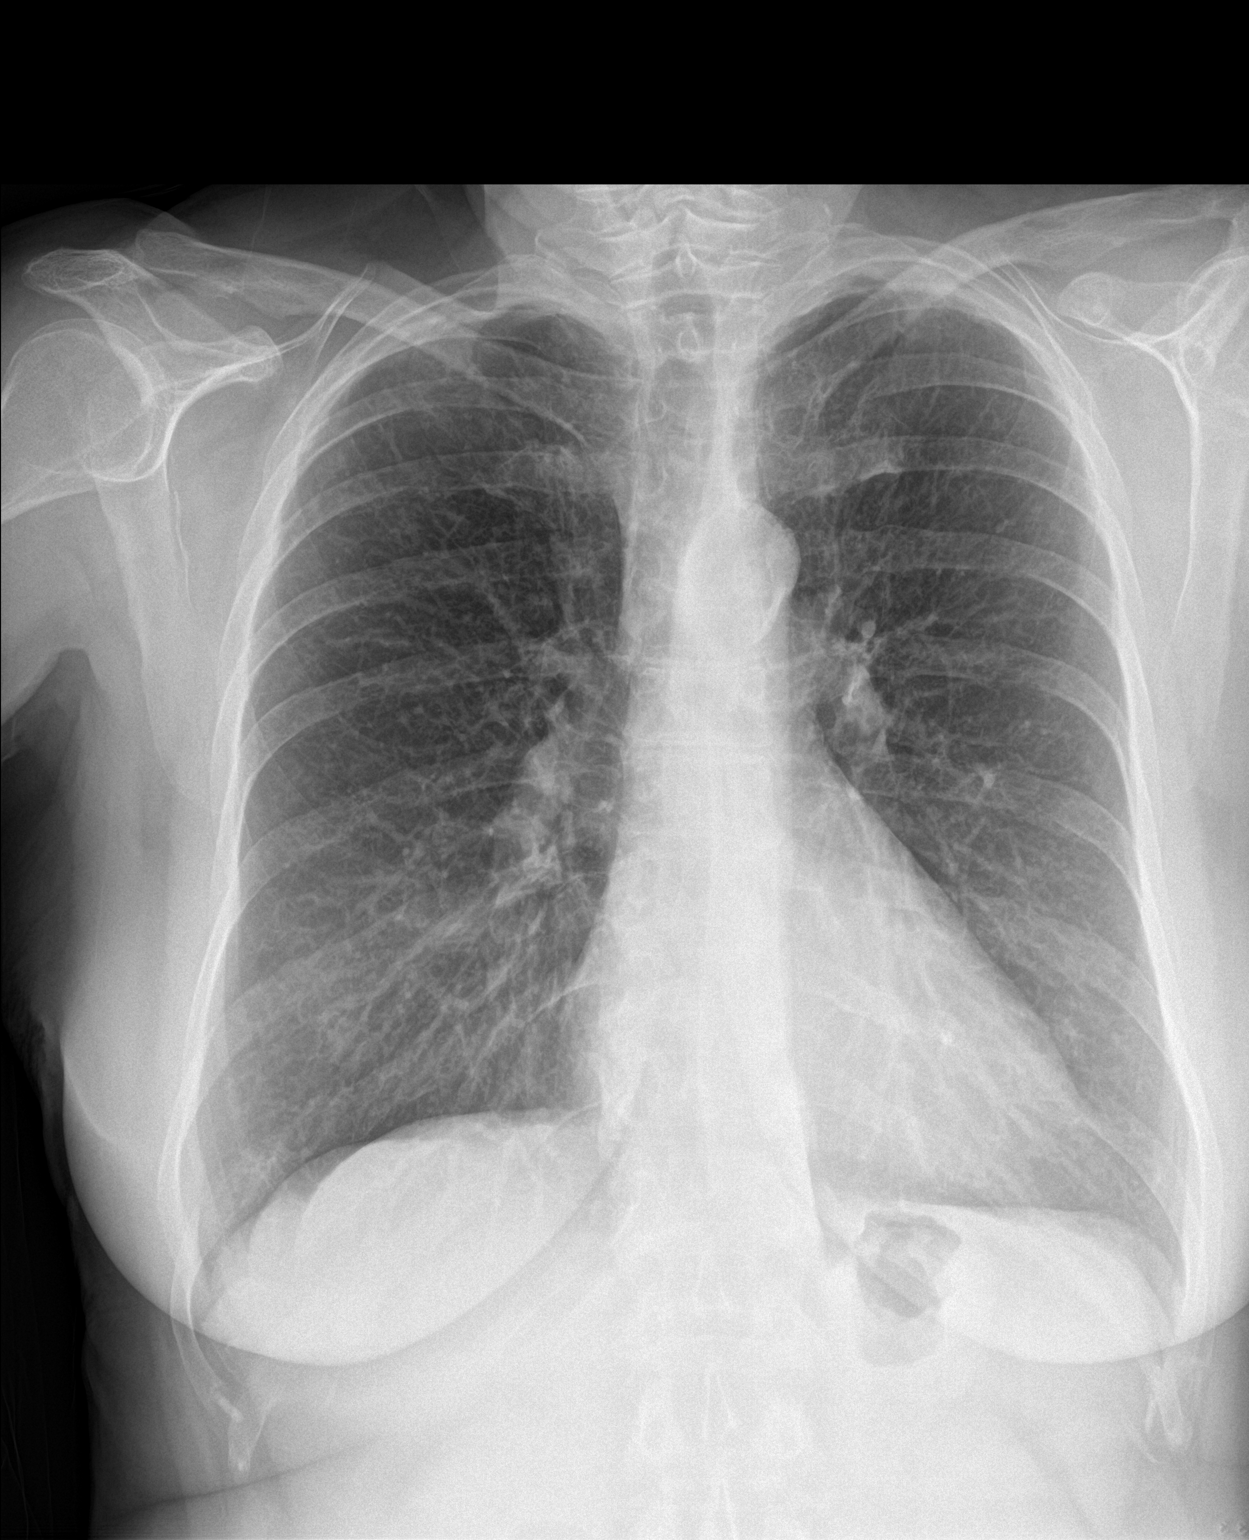

[chest lat]
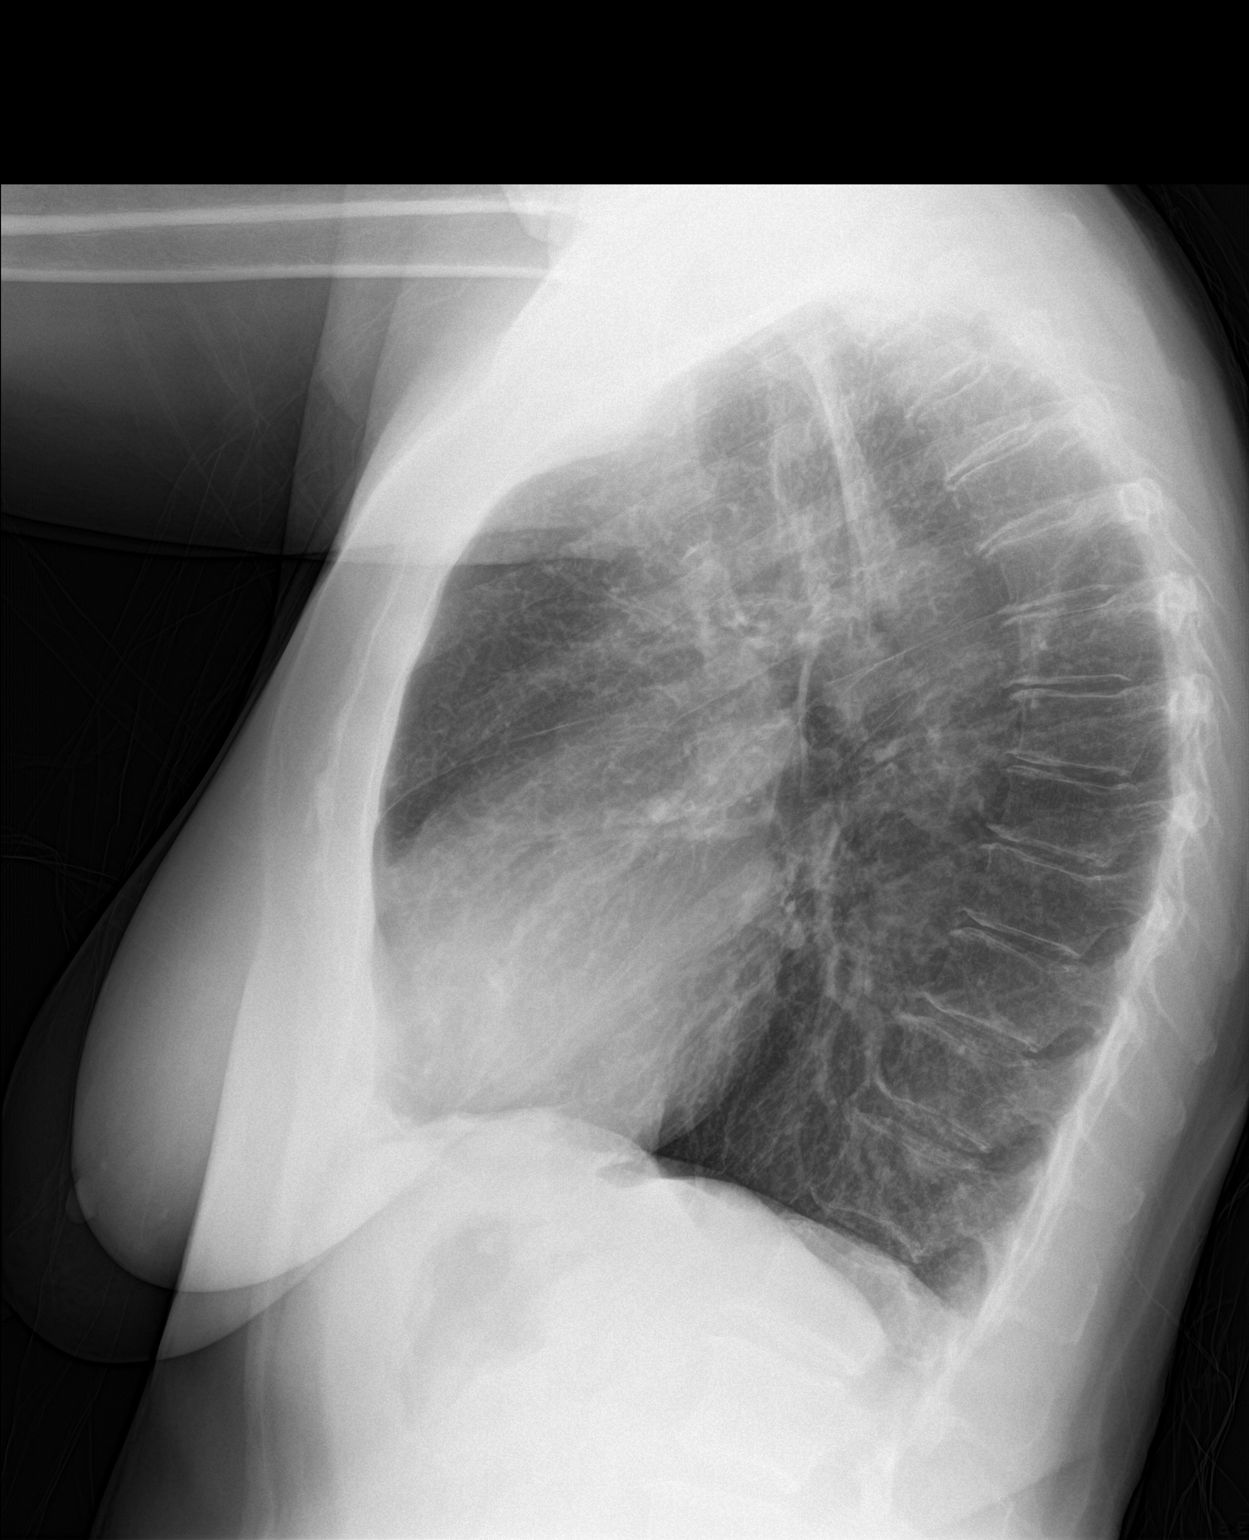

[2 of 2 positions shown; findings below may reference images not displayed]

FINDINGS: Biapical pleuroparenchymal scarring. Atherosclerotic calcification
of the aortic arch. Borderline enlargement of the cardiopericardial
silhouette.

Multilevel thoracic spondylosis. Large lung volumes, suspicious for
emphysema.
IMPRESSION: 1. Large lung volumes, suspicious for emphysema.
2. Atherosclerotic calcification of the aortic arch.
3. Borderline enlargement of the cardiopericardial silhouette.

## 2017-09-07 ENCOUNTER — Other Ambulatory Visit: Payer: Self-pay | Admitting: Family Medicine

## 2017-09-07 DIAGNOSIS — B0229 Other postherpetic nervous system involvement: Secondary | ICD-10-CM

## 2017-09-07 DIAGNOSIS — I10 Essential (primary) hypertension: Secondary | ICD-10-CM

## 2017-10-01 ENCOUNTER — Telehealth: Payer: Self-pay | Admitting: Family Medicine

## 2017-10-01 NOTE — Telephone Encounter (Signed)
sched awv for tues march 12th

## 2017-10-13 ENCOUNTER — Other Ambulatory Visit: Payer: Self-pay | Admitting: Family Medicine

## 2017-10-13 DIAGNOSIS — I10 Essential (primary) hypertension: Secondary | ICD-10-CM

## 2017-10-14 ENCOUNTER — Other Ambulatory Visit: Payer: PPO

## 2017-10-14 DIAGNOSIS — R7309 Other abnormal glucose: Secondary | ICD-10-CM | POA: Diagnosis not present

## 2017-10-14 DIAGNOSIS — E7849 Other hyperlipidemia: Secondary | ICD-10-CM

## 2017-10-14 DIAGNOSIS — I10 Essential (primary) hypertension: Secondary | ICD-10-CM

## 2017-10-14 DIAGNOSIS — E034 Atrophy of thyroid (acquired): Secondary | ICD-10-CM | POA: Diagnosis not present

## 2017-10-14 DIAGNOSIS — J432 Centrilobular emphysema: Secondary | ICD-10-CM

## 2017-10-15 ENCOUNTER — Encounter: Payer: Self-pay | Admitting: Family Medicine

## 2017-10-15 DIAGNOSIS — R7303 Prediabetes: Secondary | ICD-10-CM | POA: Insufficient documentation

## 2017-10-15 DIAGNOSIS — R7309 Other abnormal glucose: Secondary | ICD-10-CM

## 2017-10-15 LAB — CBC WITH DIFFERENTIAL/PLATELET
BASOS ABS: 67 {cells}/uL (ref 0–200)
Basophils Relative: 0.9 %
Eosinophils Absolute: 170 cells/uL (ref 15–500)
Eosinophils Relative: 2.3 %
HEMATOCRIT: 48.8 % — AB (ref 35.0–45.0)
HEMOGLOBIN: 16.5 g/dL — AB (ref 11.7–15.5)
Lymphs Abs: 2057 cells/uL (ref 850–3900)
MCH: 29.8 pg (ref 27.0–33.0)
MCHC: 33.8 g/dL (ref 32.0–36.0)
MCV: 88.1 fL (ref 80.0–100.0)
MONOS PCT: 9.8 %
MPV: 12 fL (ref 7.5–12.5)
NEUTROS ABS: 4381 {cells}/uL (ref 1500–7800)
NEUTROS PCT: 59.2 %
Platelets: 123 10*3/uL — ABNORMAL LOW (ref 140–400)
RBC: 5.54 10*6/uL — ABNORMAL HIGH (ref 3.80–5.10)
RDW: 12.5 % (ref 11.0–15.0)
Total Lymphocyte: 27.8 %
WBC mixed population: 725 cells/uL (ref 200–950)
WBC: 7.4 10*3/uL (ref 3.8–10.8)

## 2017-10-15 LAB — COMPLETE METABOLIC PANEL WITH GFR
AG RATIO: 1.8 (calc) (ref 1.0–2.5)
ALBUMIN MSPROF: 4.3 g/dL (ref 3.6–5.1)
ALT: 23 U/L (ref 6–29)
AST: 23 U/L (ref 10–35)
Alkaline phosphatase (APISO): 104 U/L (ref 33–130)
BUN: 18 mg/dL (ref 7–25)
CALCIUM: 9.6 mg/dL (ref 8.6–10.4)
CO2: 28 mmol/L (ref 20–32)
CREATININE: 0.76 mg/dL (ref 0.50–0.99)
Chloride: 104 mmol/L (ref 98–110)
GFR, EST AFRICAN AMERICAN: 93 mL/min/{1.73_m2} (ref >=60)
GFR, EST NON AFRICAN AMERICAN: 80 mL/min/{1.73_m2} (ref >=60)
GLOBULIN: 2.4 g/dL (ref 1.9–3.7)
Glucose, Bld: 90 mg/dL (ref 65–99)
POTASSIUM: 3.7 mmol/L (ref 3.5–5.3)
SODIUM: 142 mmol/L (ref 135–146)
TOTAL PROTEIN: 6.7 g/dL (ref 6.1–8.1)
Total Bilirubin: 1.2 mg/dL (ref 0.2–1.2)

## 2017-10-15 LAB — LIPID PANEL
Cholesterol: 197 mg/dL (ref <200)
HDL: 63 mg/dL (ref >50)
LDL Cholesterol (Calc): 111 mg/dL (calc) — ABNORMAL HIGH
NON-HDL CHOLESTEROL (CALC): 134 mg/dL — AB (ref <130)
Total CHOL/HDL Ratio: 3.1 (calc) (ref <5.0)
Triglycerides: 121 mg/dL (ref <150)

## 2017-10-15 LAB — HEMOGLOBIN A1C
HEMOGLOBIN A1C: 5.6 %{Hb} (ref <5.7)
Mean Plasma Glucose: 114 (calc)
eAG (mmol/L): 6.3 (calc)

## 2017-10-15 LAB — T4, FREE: FREE T4: 1.4 ng/dL (ref 0.8–1.8)

## 2017-10-15 LAB — TSH: TSH: 2.79 mIU/L (ref 0.40–4.50)

## 2017-10-20 ENCOUNTER — Ambulatory Visit (INDEPENDENT_AMBULATORY_CARE_PROVIDER_SITE_OTHER): Payer: PPO | Admitting: Family Medicine

## 2017-10-20 ENCOUNTER — Other Ambulatory Visit: Payer: Self-pay | Admitting: Family Medicine

## 2017-10-20 ENCOUNTER — Ambulatory Visit (INDEPENDENT_AMBULATORY_CARE_PROVIDER_SITE_OTHER): Payer: PPO

## 2017-10-20 ENCOUNTER — Encounter: Payer: Self-pay | Admitting: Family Medicine

## 2017-10-20 VITALS — BP 118/72 | HR 62 | Temp 98.2°F | Resp 16 | Ht 62.0 in | Wt 174.4 lb

## 2017-10-20 DIAGNOSIS — E034 Atrophy of thyroid (acquired): Secondary | ICD-10-CM | POA: Diagnosis not present

## 2017-10-20 DIAGNOSIS — J432 Centrilobular emphysema: Secondary | ICD-10-CM

## 2017-10-20 DIAGNOSIS — I10 Essential (primary) hypertension: Secondary | ICD-10-CM

## 2017-10-20 DIAGNOSIS — Z720 Tobacco use: Secondary | ICD-10-CM

## 2017-10-20 DIAGNOSIS — R7309 Other abnormal glucose: Secondary | ICD-10-CM

## 2017-10-20 DIAGNOSIS — Z1231 Encounter for screening mammogram for malignant neoplasm of breast: Secondary | ICD-10-CM

## 2017-10-20 DIAGNOSIS — J3089 Other allergic rhinitis: Secondary | ICD-10-CM | POA: Diagnosis not present

## 2017-10-20 DIAGNOSIS — Z0000 Encounter for general adult medical examination without abnormal findings: Secondary | ICD-10-CM | POA: Diagnosis not present

## 2017-10-20 DIAGNOSIS — D696 Thrombocytopenia, unspecified: Secondary | ICD-10-CM

## 2017-10-20 DIAGNOSIS — Z13820 Encounter for screening for osteoporosis: Secondary | ICD-10-CM

## 2017-10-20 DIAGNOSIS — E7849 Other hyperlipidemia: Secondary | ICD-10-CM | POA: Diagnosis not present

## 2017-10-20 DIAGNOSIS — D582 Other hemoglobinopathies: Secondary | ICD-10-CM

## 2017-10-20 DIAGNOSIS — Z1239 Encounter for other screening for malignant neoplasm of breast: Secondary | ICD-10-CM

## 2017-10-20 DIAGNOSIS — B0229 Other postherpetic nervous system involvement: Secondary | ICD-10-CM

## 2017-10-20 DIAGNOSIS — E2839 Other primary ovarian failure: Secondary | ICD-10-CM | POA: Diagnosis not present

## 2017-10-20 MED ORDER — LISINOPRIL 20 MG PO TABS: ORAL_TABLET | ORAL | 3 refills | Status: DC

## 2017-10-20 MED ORDER — SYNTHROID 100 MCG PO TABS: ORAL_TABLET | ORAL | 11 refills | Status: DC

## 2017-10-20 MED ORDER — FLUTICASONE PROPIONATE HFA 110 MCG/ACT IN AERO
1.0000 | INHALATION_SPRAY | Freq: Two times a day (BID) | RESPIRATORY_TRACT | 11 refills | Status: DC | PRN
Start: 2017-10-20 — End: 2019-08-22

## 2017-10-20 MED ORDER — HYDROCHLOROTHIAZIDE 12.5 MG PO TABS: ORAL_TABLET | ORAL | 3 refills | Status: DC

## 2017-10-20 MED ORDER — METOPROLOL TARTRATE 50 MG PO TABS: ORAL_TABLET | ORAL | 3 refills | Status: DC

## 2017-10-20 MED ORDER — GABAPENTIN 100 MG PO CAPS: ORAL_CAPSULE | ORAL | 3 refills | Status: DC

## 2017-10-20 NOTE — Progress Notes (Signed)
Subjective:   Sarah Adkins is a 72 y.o. female who presents for an Initial Medicare Annual Wellness Visit.  Review of Systems      Cardiac Risk Factors include: hypertension;advanced age (>31men, >47 women);dyslipidemia;obesity (BMI >30kg/m2);smoking/ tobacco exposure     Objective:    Today's Vitals   10/20/17 1015  BP: 118/72  Pulse: 62  Resp: 16  Temp: 98.2 F (36.8 C)  TempSrc: Oral  Weight: 174 lb 6.4 oz (79.1 kg)  Height: 5\' 2"  (1.575 m)   Body mass index is 31.9 kg/m.  No flowsheet data found.  Current Medications (verified) Outpatient Encounter Medications as of 10/20/2017  Medication Sig  . albuterol (PROVENTIL HFA;VENTOLIN HFA) 108 (90 Base) MCG/ACT inhaler Inhale 2 puffs into the lungs every 6 (six) hours as needed for wheezing or shortness of breath.  Marland Kitchen aspirin EC 325 MG tablet Take 325 mg by mouth daily.  Marland Kitchen atorvastatin (LIPITOR) 10 MG tablet Take 1 tablet (10 mg total) by mouth at bedtime.  . Cholecalciferol (VITAMIN D3) 1000 units CAPS Take 1,000 Units by mouth daily.  Marland Kitchen FLOVENT HFA 110 MCG/ACT inhaler INHALE 1 PUFF INTO THE LUNGS TWICE DAILY, RINSE MOUTH WITH WATER AFTER USE (Patient taking differently: taking as needed)  . gabapentin (NEURONTIN) 100 MG capsule TAKE 2 CAPSULES BY MOUTH THREE TIMES DAILY  . hydrochlorothiazide (HYDRODIURIL) 12.5 MG tablet TAKE 1 TABLET(12.5 MG) BY MOUTH DAILY  . lisinopril (PRINIVIL,ZESTRIL) 20 MG tablet TAKE 1 TABLET(20 MG) BY MOUTH DAILY  . metoprolol tartrate (LOPRESSOR) 50 MG tablet TAKE 1 TABLET(50 MG) BY MOUTH TWICE DAILY  . SYNTHROID 100 MCG tablet TAKE 1 TABLET BY MOUTH EVERY MORNING, except omit on Sunday   No facility-administered encounter medications on file as of 10/20/2017.     Allergies (verified) Erythromycin   History: Past Medical History:  Diagnosis Date  . Hypertension   . Hyperthyroidism   . Skin cancer of face    Past Surgical History:  Procedure Laterality Date  . FOOT SURGERY Left   .  HAND SURGERY Bilateral   . LAPAROSCOPIC TUBAL LIGATION     Family History  Problem Relation Age of Onset  . Stroke Mother   . Heart attack Mother   . Hypertension Mother   . Congestive Heart Failure Father   . Seizures Brother    Social History   Socioeconomic History  . Marital status: Widowed    Spouse name: None  . Number of children: None  . Years of education: None  . Highest education level: None  Social Needs  . Financial resource strain: Not hard at all  . Food insecurity - worry: Never true  . Food insecurity - inability: Never true  . Transportation needs - medical: No  . Transportation needs - non-medical: No  Occupational History  . None  Tobacco Use  . Smoking status: Current Every Day Smoker    Packs/day: 0.25    Years: 50.00    Pack years: 12.50    Types: Cigarettes  . Smokeless tobacco: Never Used  . Tobacco comment: Prior failed Chantix. Currently cutting down < 6 cigs daily, will use NRT  Substance and Sexual Activity  . Alcohol use: Yes    Alcohol/week: 0.0 oz    Comment: occasional beer  . Drug use: No  . Sexual activity: None  Other Topics Concern  . None  Social History Narrative  . None    Tobacco Counseling Ready to quit: Yes Counseling given: Yes Comment: Prior failed  Chantix. Currently cutting down < 6 cigs daily, will use NRT   Clinical Intake:  Pre-visit preparation completed: Yes  Pain : No/denies pain     Nutritional Status: BMI > 30  Obese Nutritional Risks: None Diabetes: No  How often do you need to have someone help you when you read instructions, pamphlets, or other written materials from your doctor or pharmacy?: 1 - Never  Interpreter Needed?: No  Information entered by :: Tiffany Hill,LPN    Activities of Daily Living In your present state of health, do you have any difficulty performing the following activities: 10/20/2017 04/22/2017  Hearing? N N  Vision? N N  Comment has reading glasses -  Difficulty  concentrating or making decisions? N N  Walking or climbing stairs? N N  Dressing or bathing? N N  Doing errands, shopping? N N  Preparing Food and eating ? N -  Using the Toilet? N -  In the past six months, have you accidently leaked urine? N -  Do you have problems with loss of bowel control? N -  Managing your Medications? N -  Managing your Finances? N -  Housekeeping or managing your Housekeeping? N -  Some recent data might be hidden     Immunizations and Health Maintenance Immunization History  Administered Date(s) Administered  . Influenza, High Dose Seasonal PF 05/29/2016, 04/22/2017  . Pneumococcal Conjugate-13 09/10/2015  . Pneumococcal Polysaccharide-23 10/13/2016  . Tdap 10/13/2016  . Zoster 08/12/2011   Health Maintenance Due  Topic Date Due  . DEXA SCAN  10/30/2012  . MAMMOGRAM  10/30/2016    Patient Care Team: Olin Hauser, DO as PCP - General (Family Medicine)  Indicate any recent Medical Services you may have received from other than [facility] providers in the past year (date may be approximate).     Assessment:   This is a routine wellness examination for [name].  Hearing/Vision screen Vision Screening Comments: Sees Dr.Woodard annually   Dietary issues and exercise activities discussed: Current Exercise Habits: The patient does not participate in regular exercise at present, Exercise limited by: None identified  Goals    . Quit Smoking     Smoking cessation discussed      Depression Screen PHQ 2/9 Scores 10/20/2017 04/22/2017 04/15/2016 02/05/2016 09/10/2015  PHQ - 2 Score 0 0 0 0 0    Fall Risk Fall Risk  10/20/2017 02/05/2016 09/10/2015  Falls in the past year? No No No    Is the patient's home free of loose throw rugs in walkways, pet beds, electrical cords, etc?   yes      Grab bars in the bathroom? no      Handrails on the stairs?   yes      Adequate lighting?   yes  Timed Get Up and Go Performed Completed in 8 seconds with no  use of assistive devices, steady gait. No intervention needed at this time.   Cognitive Function:     6CIT Screen 10/20/2017  What Year? 0 points  What month? 0 points  What time? 0 points  Count back from 20 0 points  Months in reverse 0 points  Repeat phrase 2 points  Total Score 2    Screening Tests Health Maintenance  Topic Date Due  . DEXA SCAN  10/30/2012  . MAMMOGRAM  10/30/2016  . COLONOSCOPY  10/14/2026 (Originally 10/30/1997)  . TETANUS/TDAP  10/14/2026  . INFLUENZA VACCINE  Completed  . Hepatitis C Screening  Completed  .  PNA vac Low Risk Adult  Completed    Qualifies for Shingles Vaccine?  Yes, discussed shingrix vaccine   Cancer Screenings: Lung: Low Dose CT Chest recommended if Age 1-80 years, 30 pack-year currently smoking OR have quit w/in 15years. Patient does not qualify. Breast: Up to date on Mammogram? No  ordered Up to date of Bone Density/Dexa? No ordered Colorectal: cologuard information given today- patient will call if she decides to do cologuard  Additional Screenings:  Hepatitis B/HIV/Syphillis:not indicated Hepatitis C Screening: completed 04/22/2017     Plan:    I have personally reviewed and addressed the Medicare Annual Wellness questionnaire and have noted the following in the patient's chart:  A. Medical and social history B. Use of alcohol, tobacco or illicit drugs  C. Current medications and supplements D. Functional ability and status E.  Nutritional status F.  Physical activity G. Advance directives H. List of other physicians I.  Hospitalizations, surgeries, and ER visits in previous 12 months J.  [place] such as hearing and vision if needed, cognitive and depression L. Referrals and appointments   In addition, I have reviewed and discussed with patient certain preventive protocols, quality metrics, and best practice recommendations. A written personalized care plan for preventive services as well as general  preventive health recommendations were provided to patient.   Signed,  Tyler Aas, LPN Nurse Health Advisor   Nurse Notes:none

## 2017-10-20 NOTE — Patient Instructions (Addendum)
Thank you for coming to the office today.  1.   Low Dose Chest CT Lung CA Screening - Age 70-74 - Smoking history >30 pack years - Annual screening for 15 years after quit date  [facility]) Burgess Estelle, RN, [place] Smoking Cessation Class Ph: [phone]  ----------------- For Mammogram screening for breast cancer / DEXA Scan (Bone mineral density) screening for osteoporosis  Call the [place] below anytime to schedule your own appointment now that order has been placed.   [facility] [address], [place] [address] Phone: [phone]  ---------------------------------------  Iron and hemoglobin platelets and Thyroid testing   DUE for FASTING BLOOD WORK (no food or drink after midnight before the lab appointment, only water or coffee without cream/sugar on the morning of)  SCHEDULE "Lab Only" visit in the morning at the clinic for lab draw in 6 MONTHS   - Make sure Lab Only appointment is at about 1 week before your next appointment, so that results will be available  For Lab Results, once available within 2-3 days of blood draw, you can can log in to MyChart online to view your results and a brief explanation. Also, we can discuss results at next follow-up visit.    Please schedule a Follow-up Appointment to: Return in about 6 months (around 04/22/2018) for HTN, Thyroid, Low Platelet, Lab review.  If you have any other questions or concerns, please feel free to call the office or send a message through [place]. You may also schedule an earlier appointment if necessary.  Additionally, you may be receiving a survey about your experience at our office within a few days to 1 week by e-mail or mail. We value your feedback.  Nobie Putnam, DO [place]

## 2017-10-20 NOTE — Assessment & Plan Note (Addendum)
Well-controlled HTN - Home BP readings - normal No known complications   Plan:  1. Continue current BP regimen - HCTZ 12.5mg  daily, Lisinopril 20mg  daily, Metoprolol 50mg  BID 2. Encourage improved lifestyle - low sodium diet, regular exercise, smoking cessation 3. Continue monitor BP outside office, bring readings to next visit, if persistently >140/90 or new symptoms notify office sooner 4. Follow-up 6 months

## 2017-10-20 NOTE — Assessment & Plan Note (Signed)
Stable without change A1c 5.6, not PreDM yet  Plan:  1. Not on any therapy currently  2. Encourage improved lifestyle - low carb, low sugar diet, reduce portion size, continue improving regular exercise - Check A1c q 6-12 months

## 2017-10-20 NOTE — Assessment & Plan Note (Signed)
Controlled cholesterol on statin and lifestyle Last lipid panel 10/2017  Plan: 1. Continue current meds - Atorvastatin 10mg , has refills 2. Continue ASA 325mg  for primary ASCVD risk reduction 3. Encourage improved lifestyle - low carb/cholesterol, reduce portion size, continue improving regular exercise 4. Follow-up yearly lipids

## 2017-10-20 NOTE — Assessment & Plan Note (Signed)
Stable without exacerbation, has had infrequent flares, last office visit for AECOPD 2017 Clinical diagnosis in past, with chronic tobacco abuse, and emphysema changes on CXR - Not adhering to Flovent maintenance therapy - using PRN infrequent - No formal Spirometry or PFTs  Plan: 1. Counseling on COPD and goal of maintenance therapy - Resume Flovent maintenance inhaler BID every day, or at least intervals certain season if more likely to have bronchitis or flare up, use regularly 1-3 months at a time to see if effective - Follow-up in future if need further Spirometry or PFTs, consider Pulm - Counseling on tobacco cessation - Offered low dose chest CT for lung CA screening, declined for now but will consider

## 2017-10-20 NOTE — Patient Instructions (Addendum)
Ms. Sarah Adkins , Thank you for taking time to come for your Medicare Wellness Visit. I appreciate your ongoing commitment to your health goals. Please review the following plan we discussed and let me know if I can assist you in the future.   Screening recommendations/referrals: Colonoscopy: cologuard information given at todays appointment Mammogram: due now Please call [phone] to schedule your mammogram.  Bone Density: due now Please call [phone] to schedule your bone density scan.  Recommended yearly ophthalmology/optometry visit for glaucoma screening and checkup Recommended yearly dental visit for hygiene and checkup  Vaccinations: Influenza vaccine: up to date Pneumococcal vaccine: up to date Tdap vaccine: up to date Shingles vaccine: zostavax completed in 2013, shingrix eligible- check with your insurance company for coverage information     Advanced directives: Please bring a copy of your health care power of attorney and living will to the office at your convenience.  Conditions/risks identified: smoking cessation discussed- declined lung cancer screening at this time.   Next appointment: Follow up in one year for your annual wellness exam.    Preventive Care 65 Years and Older, Female Preventive care refers to lifestyle choices and visits with your health care provider that can promote health and wellness. What does preventive care include?  A yearly physical exam. This is also called an annual well check.  Dental exams once or twice a year.  Routine eye exams. Ask your health care provider how often you should have your eyes checked.  Personal lifestyle choices, including:  Daily care of your teeth and gums.  Regular physical activity.  Eating a healthy diet.  Avoiding tobacco and drug use.  Limiting alcohol use.  Practicing safe sex.  Taking low-dose aspirin every day.  Taking vitamin and mineral supplements as recommended by your health care  provider. What happens during an annual well check? The services and screenings done by your health care provider during your annual well check will depend on your age, overall health, lifestyle risk factors, and family history of disease. Counseling  Your health care provider may ask you questions about your:  Alcohol use.  Tobacco use.  Drug use.  Emotional well-being.  Home and relationship well-being.  Sexual activity.  Eating habits.  History of falls.  Memory and ability to understand (cognition).  Work and work Statistician.  Reproductive health. Screening  You may have the following tests or measurements:  Height, weight, and BMI.  Blood pressure.  Lipid and cholesterol levels. These may be checked every 5 years, or more frequently if you are over 9 years old.  Skin check.  Lung cancer screening. You may have this screening every year starting at age 79 if you have a 30-pack-year history of smoking and currently smoke or have quit within the past 15 years.  Fecal occult blood test (FOBT) of the stool. You may have this test every year starting at age 54.  Flexible sigmoidoscopy or colonoscopy. You may have a sigmoidoscopy every 5 years or a colonoscopy every 10 years starting at age 48.  Hepatitis C blood test.  Hepatitis B blood test.  Sexually transmitted disease (STD) testing.  Diabetes screening. This is done by checking your blood sugar (glucose) after you have not eaten for a while (fasting). You may have this done every 1-3 years.  Bone density scan. This is done to screen for osteoporosis. You may have this done starting at age 25.  Mammogram. This may be done every 1-2 years. Talk to your health care  provider about how often you should have regular mammograms. Talk with your health care provider about your test results, treatment options, and if necessary, the need for more tests. Vaccines  Your health care provider may recommend certain  vaccines, such as:  Influenza vaccine. This is recommended every year.  Tetanus, diphtheria, and acellular pertussis (Tdap, Td) vaccine. You may need a Td booster every 10 years.  Zoster vaccine. You may need this after age 48.  Pneumococcal 13-valent conjugate (PCV13) vaccine. One dose is recommended after age 55.  Pneumococcal polysaccharide (PPSV23) vaccine. One dose is recommended after age 84. Talk to your health care provider about which screenings and vaccines you need and how often you need them. This information is not intended to replace advice given to you by your health care provider. Make sure you discuss any questions you have with your health care provider. Document Released: 08/24/2015 Document Revised: 04/16/2016 Document Reviewed: 05/29/2015 Elsevier Interactive Patient Education  2017 [place] Prevention in the Home Falls can cause injuries. They can happen to people of all ages. There are many things you can do to make your home safe and to help prevent falls. What can I do on the outside of my home?  Regularly fix the edges of walkways and driveways and fix any cracks.  Remove anything that might make you trip as you walk through a door, such as a raised step or threshold.  Trim any bushes or trees on the path to your home.  Use bright outdoor lighting.  Clear any walking paths of anything that might make someone trip, such as rocks or tools.  Regularly check to see if handrails are loose or broken. Make sure that both sides of any steps have handrails.  Any raised decks and porches should have guardrails on the edges.  Have any leaves, snow, or ice cleared regularly.  Use sand or salt on walking paths during winter.  Clean up any spills in your garage right away. This includes oil or grease spills. What can I do in the bathroom?  Use night lights.  Install grab bars by the toilet and in the tub and shower. Do not use towel bars as grab  bars.  Use non-skid mats or decals in the tub or shower.  If you need to sit down in the shower, use a plastic, non-slip stool.  Keep the floor dry. Clean up any water that spills on the floor as soon as it happens.  Remove soap buildup in the tub or shower regularly.  Attach bath mats securely with double-sided non-slip rug tape.  Do not have throw rugs and other things on the floor that can make you trip. What can I do in the bedroom?  Use night lights.  Make sure that you have a light by your bed that is easy to reach.  Do not use any sheets or blankets that are too big for your bed. They should not hang down onto the floor.  Have a firm chair that has side arms. You can use this for support while you get dressed.  Do not have throw rugs and other things on the floor that can make you trip. What can I do in the kitchen?  Clean up any spills right away.  Avoid walking on wet floors.  Keep items that you use a lot in easy-to-reach places.  If you need to reach something above you, use a strong step stool that has a grab bar.  Keep electrical cords out of the way.  Do not use floor polish or wax that makes floors slippery. If you must use wax, use non-skid floor wax.  Do not have throw rugs and other things on the floor that can make you trip. What can I do with my stairs?  Do not leave any items on the stairs.  Make sure that there are handrails on both sides of the stairs and use them. Fix handrails that are broken or loose. Make sure that handrails are as long as the stairways.  Check any carpeting to make sure that it is firmly attached to the stairs. Fix any carpet that is loose or worn.  Avoid having throw rugs at the top or bottom of the stairs. If you do have throw rugs, attach them to the floor with carpet tape.  Make sure that you have a light switch at the top of the stairs and the bottom of the stairs. If you do not have them, ask someone to add them for  you. What else can I do to help prevent falls?  Wear shoes that:  Do not have high heels.  Have rubber bottoms.  Are comfortable and fit you well.  Are closed at the toe. Do not wear sandals.  If you use a stepladder:  Make sure that it is fully opened. Do not climb a closed stepladder.  Make sure that both sides of the stepladder are locked into place.  Ask someone to hold it for you, if possible.  Clearly mark and make sure that you can see:  Any grab bars or handrails.  First and last steps.  Where the edge of each step is.  Use tools that help you move around (mobility aids) if they are needed. These include:  Canes.  Walkers.  Scooters.  Crutches.  Turn on the lights when you go into a dark area. Replace any light bulbs as soon as they burn out.  Set up your furniture so you have a clear path. Avoid moving your furniture around.  If any of your floors are uneven, fix them.  If there are any pets around you, be aware of where they are.  Review your medicines with your doctor. Some medicines can make you feel dizzy. This can increase your chance of falling. Ask your doctor what other things that you can do to help prevent falls. This information is not intended to replace advice given to you by your health care provider. Make sure you discuss any questions you have with your health care provider. Document Released: 05/24/2009 Document Revised: 01/03/2016 Document Reviewed: 09/01/2014 Elsevier Interactive Patient Education  2017 [employer].

## 2017-10-20 NOTE — Assessment & Plan Note (Signed)
Refill Gabapentin 

## 2017-10-20 NOTE — Assessment & Plan Note (Addendum)
Stable hypothyroidism, w/o weight gain and is asymptomatic - Last TSH normal 10/2017 On 6 day dosing, not daily  Plan: 1. Continue current brand Synthroid 137mcg daily 6 days week (not on Sundays) new rx for #25 pills sent to pharmacy Check thyroid labs q 6 months

## 2017-10-20 NOTE — Progress Notes (Signed)
Subjective:    Patient ID: Sarah Adkins, female    DOB: 1945-11-27, 72 y.o.   MRN: [id number]  Sarah Adkins is a 72 y.o. female presenting on 10/20/2017 for Annual Exam; Hypertension; and Hypothyroidism   HPI   Here for Annual Physical and Lab Review  Hypothyroidism Reports no new concerns, since last visit 04/2017, we advised her to consider inc dose of Synthroid to include Sundays but she did get enough pill # from pharmacy to do this, since instructions were for only 6 days weekly, she never increased. She feels thyroid is controlled on current dose, no further abnormal weight gain, wt has remained stable since 04/2017 - Continues Synthroid 156mcg only 6 days, not on Sundays. She gets 25 pills per bottle, despite ordered as #90 - Last TSH 10/2017 normal TSH  CHRONIC HTN Reports check BP at home, normal readings controlled. Current Meds - HCTZ 12.5mg , Lisinopril 20mg  daily, Metoprolol 50mg  BID Reports good compliance, did take meds today. Tolerating well, w/o complaints. Lifestyle: - Exercise: Walking dog multiple times daily  COPD / TOBACCO ABUSE: - History of COPD, without significant recent flare ups. She has Flovent 1 puff BID but not using regularly, she has only used this PRN limited relief, usually worse in other environments, traveled to [place] and had more difficult time, rarely using Albuterol - Chronic tobacco history with cig smoking for 50 years avg 0.5ppd, remains on lower amount with cut back down to 4 cigs a day and only partial smoking each cig - Unchanged since last visit. No further quit attempts, failed Chantix in past with vivid dreams - Not successful with NRT - Not ready to quit at this time. Still trying to cut back slowly. Considering E-cigs  Elevated A1c Recent lab showed A1c 5.6, still. No change. She has tried to improve diet. Fam history of DM  Additional issues - Elevated Hemoglobin and Low Platelets on recent labs - no prior history of these issues,  however her Hemoglobin has been borderline elevated in past 1-2 readings over past year. She has no history of low platelet. Denies any issues with easy bleeding or other clotting problems. No family history of this.  Health Maintenance:  Breast CA Screening: Due for mammogram screening. Last mammogram result negative bi rads 2 (12/19/2014) done at [place] in [facility], now plans to go to local [place]. Prior history abnormal mammogram >15-20 yr ago, biopsy negative. Closest fam history is 1st cousin with breast cancer. Currently asymptomatic.  Due for routine DEXA screening for osteoporosis, age 41, estrogen deficiency, no prior major fractures, to be ordered and she will schedule at [place].  UTD on TDap UTD PNA vaccine series UTD FLu vaccine this season UTD routine Hep C screening, 04/2017 negative  Depression screen [facility] 2/9 10/20/2017 04/22/2017 04/15/2016  Decreased Interest 0 0 0  Down, Depressed, Hopeless 0 0 0  PHQ - 2 Score 0 0 0    Past Medical History:  Diagnosis Date  . Hypertension   . Hyperthyroidism   . Skin cancer of face    Past Surgical History:  Procedure Laterality Date  . FOOT SURGERY Left   . HAND SURGERY Bilateral   . LAPAROSCOPIC TUBAL LIGATION     Social History   Socioeconomic History  . Marital status: Widowed    Spouse name: Not on file  . Number of children: Not on file  . Years of education: Not on file  . Highest education level: Not on  file  Social Needs  . Financial resource strain: Not hard at all  . Food insecurity - worry: Never true  . Food insecurity - inability: Never true  . Transportation needs - medical: No  . Transportation needs - non-medical: No  Occupational History  . Not on file  Tobacco Use  . Smoking status: Current Every Day Smoker    Packs/day: 0.25    Years: 50.00    Pack years: 12.50    Types: Cigarettes  . Smokeless tobacco: Never Used  . Tobacco comment: Prior failed Chantix.  Currently cutting down < 6 cigs daily, will use NRT  Substance and Sexual Activity  . Alcohol use: Yes    Alcohol/week: 0.0 oz    Comment: occasional beer  . Drug use: No  . Sexual activity: Not on file  Other Topics Concern  . Not on file  Social History Narrative  . Not on file   Family History  Problem Relation Age of Onset  . Stroke Mother   . Heart attack Mother   . Hypertension Mother   . Congestive Heart Failure Father   . Seizures Brother    Current Outpatient Medications on File Prior to Visit  Medication Sig  . albuterol (PROVENTIL HFA;VENTOLIN HFA) 108 (90 Base) MCG/ACT inhaler Inhale 2 puffs into the lungs every 6 (six) hours as needed for wheezing or shortness of breath.  Marland Kitchen aspirin EC 325 MG tablet Take 325 mg by mouth daily.  Marland Kitchen atorvastatin (LIPITOR) 10 MG tablet Take 1 tablet (10 mg total) by mouth at bedtime.  . Cholecalciferol (VITAMIN D3) 1000 units CAPS Take 1,000 Units by mouth daily.   No current facility-administered medications on file prior to visit.     Review of Systems  Constitutional: Negative for activity change, appetite change, chills, diaphoresis, fatigue and fever.  HENT: Negative for congestion, hearing loss and sinus pressure.   Eyes: Negative for visual disturbance.  Respiratory: Positive for cough. Negative for apnea, chest tightness, shortness of breath and wheezing.   Cardiovascular: Negative for chest pain, palpitations and leg swelling.  Gastrointestinal: Negative for abdominal pain, anal bleeding, blood in stool, constipation, diarrhea, nausea and vomiting.  Endocrine: Negative for cold intolerance.  Genitourinary: Negative for difficulty urinating, dysuria, frequency and hematuria.  Musculoskeletal: Negative for arthralgias, back pain and neck pain.  Skin: Negative for rash.  Allergic/Immunologic: Negative for environmental allergies.  Neurological: Negative for dizziness, weakness, light-headedness, numbness and headaches.    Hematological: Negative for adenopathy. Does not bruise/bleed easily.  Psychiatric/Behavioral: Negative for behavioral problems, dysphoric mood and sleep disturbance. The patient is not nervous/anxious.    Per HPI unless specifically indicated above     Objective:    BP 118/72   Pulse 62   Temp 98.2 F (36.8 C) (Oral)   Resp 16   Ht 5\' 2"  (1.575 m)   Wt 174 lb 6.4 oz (79.1 kg)   BMI 31.90 kg/m   Wt Readings from Last 3 Encounters:  10/20/17 174 lb 6.4 oz (79.1 kg)  10/20/17 174 lb 6.4 oz (79.1 kg)  04/22/17 174 lb (78.9 kg)    Physical Exam  Constitutional: She is oriented to person, place, and time. She appears well-developed and well-nourished. No distress.  Well-appearing, comfortable, cooperative  HENT:  Head: Normocephalic and atraumatic.  Mouth/Throat: Oropharynx is clear and moist.  Eyes: Conjunctivae and EOM are normal. Pupils are equal, round, and reactive to light. Right eye exhibits no discharge. Left eye exhibits no discharge.  Neck:  Normal range of motion. Neck supple. No thyromegaly present.  Cardiovascular: Normal rate, regular rhythm, normal heart sounds and intact distal pulses.  No murmur heard. Pulmonary/Chest: Effort normal. No respiratory distress. She has no wheezes. She has no rales.  Mild reduced air movement diffusely, speaks full sentences. Good effort still. No focal wheezing or crackles.  Abdominal: Soft. Bowel sounds are normal. She exhibits no distension and no mass. There is no tenderness.  Musculoskeletal: Normal range of motion. She exhibits no edema or tenderness.  Upper / Lower Extremities: - Normal muscle tone, strength bilateral upper extremities 5/5, lower extremities 5/5  Lymphadenopathy:    She has no cervical adenopathy.  Neurological: She is alert and oriented to person, place, and time.  Distal sensation intact to light touch all extremities  Skin: Skin is warm and dry. No rash noted. She is not diaphoretic. No erythema.   Psychiatric: She has a normal mood and affect. Her behavior is normal.  Well groomed, good eye contact, normal speech and thoughts  Nursing note and vitals reviewed.      Results for orders placed or performed in visit on 10/14/17  T4, free  Result Value Ref Range   Free T4 1.4 0.8 - 1.8 ng/dL  TSH  Result Value Ref Range   TSH 2.79 0.40 - 4.50 mIU/L  CBC with Differential/Platelet  Result Value Ref Range   WBC 7.4 3.8 - 10.8 Thousand/uL   RBC 5.54 (H) 3.80 - 5.10 Million/uL   Hemoglobin 16.5 (H) 11.7 - 15.5 g/dL   HCT 48.8 (H) 35.0 - 45.0 %   MCV 88.1 80.0 - 100.0 fL   MCH 29.8 27.0 - 33.0 pg   MCHC 33.8 32.0 - 36.0 g/dL   RDW 12.5 11.0 - 15.0 %   Platelets 123 (L) 140 - 400 Thousand/uL   MPV 12.0 7.5 - 12.5 fL   Neutro Abs 4,381 1,500 - 7,800 cells/uL   Lymphs Abs 2,057 850 - 3,900 cells/uL   WBC mixed population 725 200 - 950 cells/uL   Eosinophils Absolute 170 15 - 500 cells/uL   Basophils Absolute 67 0 - 200 cells/uL   Neutrophils Relative % 59.2 %   Total Lymphocyte 27.8 %   Monocytes Relative 9.8 %   Eosinophils Relative 2.3 %   Basophils Relative 0.9 %  Lipid panel  Result Value Ref Range   Cholesterol 197 <200 mg/dL   HDL 63 >50 mg/dL   Triglycerides 121 <150 mg/dL   LDL Cholesterol (Calc) 111 (H) mg/dL (calc)   Total CHOL/HDL Ratio 3.1 <5.0 (calc)   Non-HDL Cholesterol (Calc) 134 (H) <130 mg/dL (calc)  Hemoglobin A1c  Result Value Ref Range   Hgb A1c MFr Bld 5.6 <5.7 % of total Hgb   Mean Plasma Glucose 114 (calc)   eAG (mmol/L) 6.3 (calc)  COMPLETE METABOLIC PANEL WITH GFR  Result Value Ref Range   Glucose, Bld 90 65 - 99 mg/dL   BUN 18 7 - 25 mg/dL   Creat 0.76 0.50 - 0.99 mg/dL   GFR, Est Non African American 80 > OR = 60 mL/min/1.24m2   GFR, Est African American 93 > OR = 60 mL/min/1.50m2   BUN/Creatinine Ratio NOT APPLICABLE 6 - 22 (calc)   Sodium 142 135 - 146 mmol/L   Potassium 3.7 3.5 - 5.3 mmol/L   Chloride 104 98 - 110 mmol/L   CO2 28  20 - 32 mmol/L   Calcium 9.6 8.6 - 10.4 mg/dL   Total Protein  6.7 6.1 - 8.1 g/dL   Albumin 4.3 3.6 - 5.1 g/dL   Globulin 2.4 1.9 - 3.7 g/dL (calc)   AG Ratio 1.8 1.0 - 2.5 (calc)   Total Bilirubin 1.2 0.2 - 1.2 mg/dL   Alkaline phosphatase (APISO) 104 33 - 130 U/L   AST 23 10 - 35 U/L   ALT 23 6 - 29 U/L      Assessment & Plan:   Problem List Items Addressed This Visit    COPD (chronic obstructive pulmonary disease) ([place])    Stable without exacerbation, has had infrequent flares, last office visit for AECOPD 2017 Clinical diagnosis in past, with chronic tobacco abuse, and emphysema changes on CXR - Not adhering to Flovent maintenance therapy - using PRN infrequent - No formal Spirometry or PFTs  Plan: 1. Counseling on COPD and goal of maintenance therapy - Resume Flovent maintenance inhaler BID every day, or at least intervals certain season if more likely to have bronchitis or flare up, use regularly 1-3 months at a time to see if effective - Follow-up in future if need further Spirometry or PFTs, consider Pulm - Counseling on tobacco cessation - Offered low dose chest CT for lung CA screening, declined for now but will consider      Relevant Medications   fluticasone (FLOVENT HFA) 110 MCG/ACT inhaler   Elevated hemoglobin A1c    Stable without change A1c 5.6, not PreDM yet  Plan:  1. Not on any therapy currently  2. Encourage improved lifestyle - low carb, low sugar diet, reduce portion size, continue improving regular exercise - Check A1c q 6-12 months       Essential hypertension    Well-controlled HTN - Home BP readings - normal No known complications   Plan:  1. Continue current BP regimen - HCTZ 12.5mg  daily, Lisinopril 20mg  daily, Metoprolol 50mg  BID 2. Encourage improved lifestyle - low sodium diet, regular exercise, smoking cessation 3. Continue monitor BP outside office, bring readings to next visit, if persistently >140/90 or new symptoms notify office  sooner 4. Follow-up 6 months      Relevant Medications   hydrochlorothiazide (HYDRODIURIL) 12.5 MG tablet   lisinopril (PRINIVIL,ZESTRIL) 20 MG tablet   metoprolol tartrate (LOPRESSOR) 50 MG tablet   Hyperlipidemia    Controlled cholesterol on statin and lifestyle Last lipid panel 10/2017  Plan: 1. Continue current meds - Atorvastatin 10mg , has refills 2. Continue ASA 325mg  for primary ASCVD risk reduction 3. Encourage improved lifestyle - low carb/cholesterol, reduce portion size, continue improving regular exercise 4. Follow-up yearly lipids       Relevant Medications   hydrochlorothiazide (HYDRODIURIL) 12.5 MG tablet   lisinopril (PRINIVIL,ZESTRIL) 20 MG tablet   metoprolol tartrate (LOPRESSOR) 50 MG tablet   Hypothyroidism    Stable hypothyroidism, w/o weight gain and is asymptomatic - Last TSH normal 10/2017 On 6 day dosing, not daily  Plan: 1. Continue current brand Synthroid 147mcg daily 6 days week (not on Sundays) new rx for #25 pills sent to pharmacy Check thyroid labs q 6 months      Relevant Medications   SYNTHROID 100 MCG tablet   metoprolol tartrate (LOPRESSOR) 50 MG tablet   Post herpetic neuralgia    Refill Gabapentin      Relevant Medications   gabapentin (NEURONTIN) 100 MG capsule   Tobacco abuse    Reviewed smoking cessation She will continue with current plan to taper down, no therapy requested at this time Considered E-Cigs but suspect nicotine amt  may be higher given amount she is using       Other Visit Diagnoses    Annual physical exam    -  Primary Reviewed and updated Health Maintenance Ordered DEXA, Mammogram, switch to [facility], not [facility], records release signed to be faxed Reviewed smoking cessation Counseling on healthy diet and regular exercise    Seasonal allergic rhinitis due to other allergic trigger       Relevant Medications   fluticasone (FLOVENT HFA) 110 MCG/ACT inhaler   Screening for breast cancer       Relevant  Orders   MM DIGITAL SCREENING BILATERAL   Estrogen deficiency       Relevant Orders   DG Bone Density   Screening for osteoporosis       Relevant Orders   DG Bone Density      Meds ordered this encounter  Medications  . SYNTHROID 100 MCG tablet    Sig: TAKE 1 TABLET BY MOUTH EVERY MORNING, except omit on Sunday    Dispense:  25 tablet    Refill:  11    Please dispense brand name Synthroid only.  . gabapentin (NEURONTIN) 100 MG capsule    Sig: TAKE 2 CAPSULES BY MOUTH THREE TIMES DAILY    Dispense:  540 capsule    Refill:  3  . hydrochlorothiazide (HYDRODIURIL) 12.5 MG tablet    Sig: TAKE 1 TABLET(12.5 MG) BY MOUTH DAILY    Dispense:  90 tablet    Refill:  3  . lisinopril (PRINIVIL,ZESTRIL) 20 MG tablet    Sig: TAKE 1 TABLET(20 MG) BY MOUTH DAILY    Dispense:  90 tablet    Refill:  3  . fluticasone (FLOVENT HFA) 110 MCG/ACT inhaler    Sig: Inhale 1 puff into the lungs 2 (two) times daily as needed.    Dispense:  12 g    Refill:  11  . metoprolol tartrate (LOPRESSOR) 50 MG tablet    Sig: TAKE 1 TABLET(50 MG) BY MOUTH TWICE DAILY    Dispense:  180 tablet    Refill:  3      Follow up plan: Return in about 6 months (around 04/22/2018) for HTN, Thyroid, Low Platelet, Lab review.   Future labs ordered for 6 months, 04/2018  Nobie Putnam, [facility] 10/20/2017, 1:22 PM

## 2017-10-20 NOTE — Assessment & Plan Note (Signed)
Reviewed smoking cessation She will continue with current plan to taper down, no therapy requested at this time Considered E-Cigs but suspect nicotine amt may be higher given amount she is using

## 2017-10-21 ENCOUNTER — Encounter: Payer: PPO | Admitting: Family Medicine

## 2017-11-11 ENCOUNTER — Inpatient Hospital Stay: Admission: RE | Admit: 2017-11-11 | Payer: Self-pay | Source: Ambulatory Visit

## 2017-11-16 ENCOUNTER — Ambulatory Visit
Admission: RE | Admit: 2017-11-16 | Discharge: 2017-11-16 | Disposition: A | Discharge status: Home / Self Care | Payer: PPO | Source: Ambulatory Visit | Attending: Family Medicine | Admitting: Family Medicine

## 2017-11-16 ENCOUNTER — Encounter: Payer: Self-pay | Admitting: Radiology

## 2017-11-16 DIAGNOSIS — Z1231 Encounter for screening mammogram for malignant neoplasm of breast: Secondary | ICD-10-CM | POA: Insufficient documentation

## 2017-11-16 DIAGNOSIS — Z1239 Encounter for other screening for malignant neoplasm of breast: Secondary | ICD-10-CM

## 2018-01-19 DIAGNOSIS — D0439 Carcinoma in situ of skin of other parts of face: Secondary | ICD-10-CM | POA: Diagnosis not present

## 2018-01-19 DIAGNOSIS — D2361 Other benign neoplasm of skin of right upper limb, including shoulder: Secondary | ICD-10-CM | POA: Diagnosis not present

## 2018-01-19 DIAGNOSIS — Z08 Encounter for follow-up examination after completed treatment for malignant neoplasm: Secondary | ICD-10-CM | POA: Diagnosis not present

## 2018-01-19 DIAGNOSIS — Z85828 Personal history of other malignant neoplasm of skin: Secondary | ICD-10-CM | POA: Diagnosis not present

## 2018-04-22 ENCOUNTER — Other Ambulatory Visit: Payer: PPO

## 2018-04-22 DIAGNOSIS — D696 Thrombocytopenia, unspecified: Secondary | ICD-10-CM | POA: Diagnosis not present

## 2018-04-22 DIAGNOSIS — R7309 Other abnormal glucose: Secondary | ICD-10-CM

## 2018-04-22 DIAGNOSIS — E034 Atrophy of thyroid (acquired): Secondary | ICD-10-CM | POA: Diagnosis not present

## 2018-04-22 DIAGNOSIS — D582 Other hemoglobinopathies: Secondary | ICD-10-CM | POA: Diagnosis not present

## 2018-04-23 LAB — CBC WITH DIFFERENTIAL/PLATELET
Basophils Absolute: 86 cells/uL (ref 0–200)
Basophils Relative: 1.2 %
EOS ABS: 166 {cells}/uL (ref 15–500)
Eosinophils Relative: 2.3 %
HCT: 47.6 % — ABNORMAL HIGH (ref 35.0–45.0)
HEMOGLOBIN: 16.2 g/dL — AB (ref 11.7–15.5)
Lymphs Abs: 2066 cells/uL (ref 850–3900)
MCH: 30.1 pg (ref 27.0–33.0)
MCHC: 34 g/dL (ref 32.0–36.0)
MCV: 88.5 fL (ref 80.0–100.0)
MPV: 11.3 fL (ref 7.5–12.5)
Monocytes Relative: 9.3 %
NEUTROS PCT: 58.5 %
Neutro Abs: 4212 cells/uL (ref 1500–7800)
Platelets: 199 10*3/uL (ref 140–400)
RBC: 5.38 10*6/uL — ABNORMAL HIGH (ref 3.80–5.10)
RDW: 13.3 % (ref 11.0–15.0)
Total Lymphocyte: 28.7 %
WBC: 7.2 10*3/uL (ref 3.8–10.8)
WBCMIX: 670 {cells}/uL (ref 200–950)

## 2018-04-23 LAB — HEMOGLOBIN A1C
Hgb A1c MFr Bld: 5.7 % of total Hgb — ABNORMAL HIGH (ref <5.7)
Mean Plasma Glucose: 117 (calc)
eAG (mmol/L): 6.5 (calc)

## 2018-04-23 LAB — TSH: TSH: 2.14 m[IU]/L (ref 0.40–4.50)

## 2018-04-23 LAB — T4, FREE: Free T4: 1.5 ng/dL (ref 0.8–1.8)

## 2018-04-23 LAB — PATHOLOGIST SMEAR REVIEW

## 2018-04-27 ENCOUNTER — Ambulatory Visit: Payer: PPO | Admitting: Family Medicine

## 2018-04-28 ENCOUNTER — Other Ambulatory Visit: Payer: Self-pay | Admitting: Family Medicine

## 2018-04-28 ENCOUNTER — Encounter: Payer: Self-pay | Admitting: Family Medicine

## 2018-04-28 ENCOUNTER — Ambulatory Visit (INDEPENDENT_AMBULATORY_CARE_PROVIDER_SITE_OTHER): Payer: PPO | Admitting: Family Medicine

## 2018-04-28 VITALS — BP 120/52 | HR 57 | Temp 98.1°F | Resp 16 | Ht 62.0 in | Wt 172.0 lb

## 2018-04-28 DIAGNOSIS — R7309 Other abnormal glucose: Secondary | ICD-10-CM

## 2018-04-28 DIAGNOSIS — E034 Atrophy of thyroid (acquired): Secondary | ICD-10-CM

## 2018-04-28 DIAGNOSIS — D582 Other hemoglobinopathies: Secondary | ICD-10-CM | POA: Diagnosis not present

## 2018-04-28 DIAGNOSIS — E7849 Other hyperlipidemia: Secondary | ICD-10-CM

## 2018-04-28 DIAGNOSIS — Z0000 Encounter for general adult medical examination without abnormal findings: Secondary | ICD-10-CM

## 2018-04-28 DIAGNOSIS — J432 Centrilobular emphysema: Secondary | ICD-10-CM

## 2018-04-28 DIAGNOSIS — D696 Thrombocytopenia, unspecified: Secondary | ICD-10-CM | POA: Diagnosis not present

## 2018-04-28 DIAGNOSIS — I10 Essential (primary) hypertension: Secondary | ICD-10-CM

## 2018-04-28 MED ORDER — LEVOTHYROXINE SODIUM 100 MCG PO TABS: ORAL_TABLET | ORAL | 3 refills | Status: DC

## 2018-04-28 MED ORDER — LEVOTHYROXINE SODIUM 100 MCG PO TABS: 100.0000 ug | ORAL_TABLET | Freq: Every day | ORAL | 3 refills | Status: DC

## 2018-04-28 NOTE — Progress Notes (Signed)
Subjective:    Patient ID: Sarah Adkins, female    DOB: [birth year], 72 y.o.   MRN: [id number]  Sarah Adkins is a 72 y.o. female presenting on 04/28/2018 for Hypothyroidism   HPI  Follow-up Hypothyroidism Last lab showed normal TSH and Free T4 on current dose Synthroid 140mcg daily. Today she requests to change from Synthroid to Levothyroxine, she thinks working about the same compared to the past. Cost and availability will be better for her, often they do not have Synthroid in stock at pharmacy. - She was HOLDING dose on Sundays as usual Denies worsening fatigue, weight gain, hair or nail changes, temperature changes  Elevated A1c Prior A1c 5.6 on avg. Now recent lab showed A1c 5.7. No prior dx of PreDM Meds: None Currently on ACEi Lifestyle: - Diet (not always adhering to low carb diet - plans to improve)  - Exercise (has plans to start walking more in future, not just walking dog) Denies hypoglycemia, polyuria, visual changes, numbness or tingling.  FOLLOW-UP ELEVATED HEMOGLOBIN / Thrombocytopenia Prior labs showed mild elevated Hgb and low Plt. Repeat labs CBC and peripheral smear now show improvements. Normalized platelets but still borderline elevated Hgb. Smear did not show any significant morphological abnormalities and did show some platelet clumping. - Today no new complaints or symptoms Denies any easy bruising or history of blood clots. Denies fatigue, dyspnea   Health Maintenance:  Due for High Dose Flu vaccine - not in stock, will return or get at pharmacy.  Still due for routine DEXA screening for osteoporosis. She has not scheduled yet.   Depression screen [facility] 2/9 04/28/2018 10/20/2017 04/22/2017  Decreased Interest 0 0 0  Down, Depressed, Hopeless 0 0 0  PHQ - 2 Score 0 0 0    Social History   Tobacco Use  . Smoking status: Current Every Day Smoker    Packs/day: 0.25    Years: 50.00    Pack years: 12.50    Types: Cigarettes  . Smokeless tobacco:  Current User  . Tobacco comment: Prior failed Chantix. Currently cutting down < 6 cigs daily, will use NRT  Substance Use Topics  . Alcohol use: Yes    Alcohol/week: 0.0 standard drinks    Comment: occasional beer  . Drug use: No    Review of Systems Per HPI unless specifically indicated above     Objective:    BP (!) 120/52   Pulse (!) 57   Temp 98.1 F (36.7 C) (Oral)   Resp 16   Ht 5\' 2"  (1.575 m)   Wt 172 lb (78 kg)   BMI 31.46 kg/m   Wt Readings from Last 3 Encounters:  04/28/18 172 lb (78 kg)  10/20/17 174 lb 6.4 oz (79.1 kg)  10/20/17 174 lb 6.4 oz (79.1 kg)    Physical Exam  Constitutional: She is oriented to person, place, and time. She appears well-developed and well-nourished. No distress.  Well-appearing, comfortable, cooperative  HENT:  Head: Normocephalic and atraumatic.  Mouth/Throat: Oropharynx is clear and moist.  Eyes: Conjunctivae are normal. Right eye exhibits no discharge. Left eye exhibits no discharge.  Cardiovascular:  Mildly bradycardic  Pulmonary/Chest: Effort normal.  Musculoskeletal: She exhibits no edema.  Neurological: She is alert and oriented to person, place, and time.  Skin: Skin is warm and dry. No rash noted. She is not diaphoretic. No erythema.  Psychiatric: She has a normal mood and affect. Her behavior is normal.  Well groomed, good eye contact, normal speech and thoughts  Nursing note and vitals reviewed.  Results for orders placed or performed in visit on 04/22/18  CBC with Differential/Platelet  Result Value Ref Range   WBC 7.2 3.8 - 10.8 Thousand/uL   RBC 5.38 (H) 3.80 - 5.10 Million/uL   Hemoglobin 16.2 (H) 11.7 - 15.5 g/dL   HCT 47.6 (H) 35.0 - 45.0 %   MCV 88.5 80.0 - 100.0 fL   MCH 30.1 27.0 - 33.0 pg   MCHC 34.0 32.0 - 36.0 g/dL   RDW 13.3 11.0 - 15.0 %   Platelets 199 140 - 400 Thousand/uL   MPV 11.3 7.5 - 12.5 fL   Neutro Abs 4,212 1,500 - 7,800 cells/uL   Lymphs Abs 2,066 850 - 3,900 cells/uL   WBC mixed  population 670 200 - 950 cells/uL   Eosinophils Absolute 166 15 - 500 cells/uL   Basophils Absolute 86 0 - 200 cells/uL   Neutrophils Relative % 58.5 %   Total Lymphocyte 28.7 %   Monocytes Relative 9.3 %   Eosinophils Relative 2.3 %   Basophils Relative 1.2 %  Hemoglobin A1c  Result Value Ref Range   Hgb A1c MFr Bld 5.7 (H) <5.7 % of total Hgb   Mean Plasma Glucose 117 (calc)   eAG (mmol/L) 6.5 (calc)  T4, free  Result Value Ref Range   Free T4 1.5 0.8 - 1.8 ng/dL  TSH  Result Value Ref Range   TSH 2.14 0.40 - 4.50 mIU/L  Pathologist smear review  Result Value Ref Range   Path Review        Assessment & Plan:   Problem List Items Addressed This Visit    Elevated hemoglobin A1c    Slightly elevated A1c up to 5.7 now, prior 5.6  Plan:  1. Not on any therapy currently  2. Encourage improved lifestyle - low carb, low sugar diet, reduce portion size, continue improving regular exercise 3. Follow-up 6 months yearly labs A1c       Hypothyroidism - Primary    Controlled on current dose Synthroid Last TSH, Free T4 normal 04/2018  Plan Patient preference now SWITCH from Synthroid to levothyroxine - ordered rx as same dose, 157mcg daily except Sundays, resent for lower pill #, refills added - If she goes out of town as mentioned to [place], may call for request refill sooner or check with pharmacy, we can send temporary rx out of state for up to 1 month max Follow-up 6 months labs      Relevant Medications   levothyroxine (SYNTHROID, LEVOTHROID) 100 MCG tablet    Other Visit Diagnoses    Thrombocytopenia ([place])       Resolved on repeat CBC.   Elevated hemoglobin ([facility])       Improved, still mildly abnormal. Had normal peripheral smear, and other cell lines. Will re-check CBC as scheduled in 6 months, future if abnormal refer Heme      Meds ordered this encounter  Medications  . DISCONTD: levothyroxine (SYNTHROID, LEVOTHROID) 100 MCG tablet    Sig: Take 1 tablet (100  mcg total) by mouth daily before breakfast.    Dispense:  90 tablet    Refill:  3    Change from Synthroid  . levothyroxine (SYNTHROID, LEVOTHROID) 100 MCG tablet    Sig: Take 1 tablet (135mcg) daily before breakfast 6 days a week. Do not take on Sundays    Dispense:  78 tablet    Refill:  3    Change from Synthroid, edited rx  skip dose on Sunday    Follow up plan: Return in about 6 months (around 10/27/2018) for Annual Physical.  Future labs ordered for 10/27/18  Nobie Putnam, [facility] 04/28/2018, 1:13 PM

## 2018-04-28 NOTE — Assessment & Plan Note (Signed)
Slightly elevated A1c up to 5.7 now, prior 5.6  Plan:  1. Not on any therapy currently  2. Encourage improved lifestyle - low carb, low sugar diet, reduce portion size, continue improving regular exercise 3. Follow-up 6 months yearly labs A1c

## 2018-04-28 NOTE — Assessment & Plan Note (Addendum)
Controlled on current dose Synthroid Last TSH, Free T4 normal 04/2018  Plan Patient preference now SWITCH from Synthroid to levothyroxine - ordered rx as same dose, 145mcg daily except Sundays, resent for lower pill #, refills added - If she goes out of town as mentioned to [place], may call for request refill sooner or check with pharmacy, we can send temporary rx out of state for up to 1 month max Follow-up 6 months labs

## 2018-04-28 NOTE — Patient Instructions (Addendum)
Thank you for coming to the office today.  Please schedule and return for a NURSE ONLY VISIT for VACCINE - Approximately around end of September or early October 2019 - Need High Dose Flu Vaccine If prefer can get at [facility] - get [place] a document showing flu shot and we can update your chart  A1c 5.7, keep up the good work on plan for exercise walking / diet changes  Sent new rx Levothyroxine 128mcg to pharmacy, let me know in future if need any additional refills or help or med sent to [place]  DUE for FASTING BLOOD WORK (no food or drink after midnight before the lab appointment, only water or coffee without cream/sugar on the morning of)  SCHEDULE "Lab Only" visit in the morning at the clinic for lab draw in 6 MONTHS   - Make sure Lab Only appointment is at about 1 week before your next appointment, so that results will be available  For Lab Results, once available within 2-3 days of blood draw, you can can log in to MyChart online to view your results and a brief explanation. Also, we can discuss results at next follow-up visit.   Please schedule a Follow-up Appointment to: Return in about 6 months (around 10/27/2018) for Annual Physical.  If you have any other questions or concerns, please feel free to call the office or send a message through [place]. You may also schedule an earlier appointment if necessary.  Additionally, you may be receiving a survey about your experience at our office within a few days to 1 week by e-mail or mail. We value your feedback.  Nobie Putnam, DO [place]

## 2018-05-26 ENCOUNTER — Other Ambulatory Visit: Payer: Self-pay | Admitting: Family Medicine

## 2018-05-26 DIAGNOSIS — E782 Mixed hyperlipidemia: Secondary | ICD-10-CM

## 2018-06-16 ENCOUNTER — Other Ambulatory Visit: Payer: Self-pay | Admitting: Family Medicine

## 2018-06-16 DIAGNOSIS — J40 Bronchitis, not specified as acute or chronic: Secondary | ICD-10-CM

## 2018-10-05 DIAGNOSIS — H43811 Vitreous degeneration, right eye: Secondary | ICD-10-CM | POA: Diagnosis not present

## 2018-10-05 DIAGNOSIS — H2513 Age-related nuclear cataract, bilateral: Secondary | ICD-10-CM | POA: Diagnosis not present

## 2018-10-26 ENCOUNTER — Ambulatory Visit: Payer: Self-pay

## 2018-10-27 ENCOUNTER — Other Ambulatory Visit: Payer: PPO

## 2018-10-27 ENCOUNTER — Other Ambulatory Visit: Payer: Self-pay

## 2018-10-27 DIAGNOSIS — I10 Essential (primary) hypertension: Secondary | ICD-10-CM

## 2018-10-27 DIAGNOSIS — E034 Atrophy of thyroid (acquired): Secondary | ICD-10-CM

## 2018-10-27 DIAGNOSIS — R7309 Other abnormal glucose: Secondary | ICD-10-CM | POA: Diagnosis not present

## 2018-10-27 DIAGNOSIS — D582 Other hemoglobinopathies: Secondary | ICD-10-CM

## 2018-10-27 DIAGNOSIS — E7849 Other hyperlipidemia: Secondary | ICD-10-CM | POA: Diagnosis not present

## 2018-10-27 DIAGNOSIS — Z0000 Encounter for general adult medical examination without abnormal findings: Secondary | ICD-10-CM

## 2018-10-28 LAB — COMPLETE METABOLIC PANEL WITH GFR
AG Ratio: 1.8 (calc) (ref 1.0–2.5)
ALBUMIN MSPROF: 4.1 g/dL (ref 3.6–5.1)
ALKALINE PHOSPHATASE (APISO): 96 U/L (ref 37–153)
ALT: 42 U/L — AB (ref 6–29)
AST: 36 U/L — AB (ref 10–35)
BILIRUBIN TOTAL: 1.1 mg/dL (ref 0.2–1.2)
BUN: 16 mg/dL (ref 7–25)
CHLORIDE: 103 mmol/L (ref 98–110)
CO2: 26 mmol/L (ref 20–32)
Calcium: 9.3 mg/dL (ref 8.6–10.4)
Creat: 0.8 mg/dL (ref 0.60–0.93)
GFR, Est African American: 87 mL/min/{1.73_m2} (ref >=60)
GFR, Est Non African American: 75 mL/min/{1.73_m2} (ref >=60)
GLUCOSE: 88 mg/dL (ref 65–99)
Globulin: 2.3 g/dL (calc) (ref 1.9–3.7)
Potassium: 4 mmol/L (ref 3.5–5.3)
Sodium: 138 mmol/L (ref 135–146)
Total Protein: 6.4 g/dL (ref 6.1–8.1)

## 2018-10-28 LAB — CBC WITH DIFFERENTIAL/PLATELET
ABSOLUTE MONOCYTES: 655 {cells}/uL (ref 200–950)
BASOS ABS: 69 {cells}/uL (ref 0–200)
BASOS PCT: 0.9 %
EOS ABS: 139 {cells}/uL (ref 15–500)
Eosinophils Relative: 1.8 %
HCT: 48 % — ABNORMAL HIGH (ref 35.0–45.0)
Hemoglobin: 16.1 g/dL — ABNORMAL HIGH (ref 11.7–15.5)
Lymphs Abs: 2156 cells/uL (ref 850–3900)
MCH: 29.9 pg (ref 27.0–33.0)
MCHC: 33.5 g/dL (ref 32.0–36.0)
MCV: 89.2 fL (ref 80.0–100.0)
MONOS PCT: 8.5 %
MPV: 11.2 fL (ref 7.5–12.5)
NEUTROS PCT: 60.8 %
Neutro Abs: 4682 cells/uL (ref 1500–7800)
PLATELETS: 201 10*3/uL (ref 140–400)
RBC: 5.38 10*6/uL — ABNORMAL HIGH (ref 3.80–5.10)
RDW: 12.9 % (ref 11.0–15.0)
TOTAL LYMPHOCYTE: 28 %
WBC: 7.7 10*3/uL (ref 3.8–10.8)

## 2018-10-28 LAB — LIPID PANEL
CHOL/HDL RATIO: 3.3 (calc) (ref <5.0)
Cholesterol: 196 mg/dL (ref <200)
HDL: 60 mg/dL (ref >=50)
LDL CHOLESTEROL (CALC): 111 mg/dL — AB
NON-HDL CHOLESTEROL (CALC): 136 mg/dL — AB (ref <130)
TRIGLYCERIDES: 131 mg/dL (ref <150)

## 2018-10-28 LAB — HEMOGLOBIN A1C
HEMOGLOBIN A1C: 5.8 %{Hb} — AB (ref <5.7)
MEAN PLASMA GLUCOSE: 120 (calc)
eAG (mmol/L): 6.6 (calc)

## 2018-10-28 LAB — TSH: TSH: 2.66 mIU/L (ref 0.40–4.50)

## 2018-10-28 LAB — T4, FREE: Free T4: 1.4 ng/dL (ref 0.8–1.8)

## 2018-11-03 ENCOUNTER — Encounter: Payer: PPO | Admitting: Family Medicine

## 2018-11-16 ENCOUNTER — Other Ambulatory Visit: Payer: Self-pay | Admitting: Family Medicine

## 2018-11-16 DIAGNOSIS — B0229 Other postherpetic nervous system involvement: Secondary | ICD-10-CM

## 2018-11-16 DIAGNOSIS — I10 Essential (primary) hypertension: Secondary | ICD-10-CM

## 2019-01-07 ENCOUNTER — Other Ambulatory Visit: Payer: Self-pay | Admitting: Family Medicine

## 2019-01-07 DIAGNOSIS — I10 Essential (primary) hypertension: Secondary | ICD-10-CM

## 2019-01-18 DIAGNOSIS — D2262 Melanocytic nevi of left upper limb, including shoulder: Secondary | ICD-10-CM | POA: Diagnosis not present

## 2019-01-18 DIAGNOSIS — Z85828 Personal history of other malignant neoplasm of skin: Secondary | ICD-10-CM | POA: Diagnosis not present

## 2019-01-18 DIAGNOSIS — D225 Melanocytic nevi of trunk: Secondary | ICD-10-CM | POA: Diagnosis not present

## 2019-01-18 DIAGNOSIS — Z08 Encounter for follow-up examination after completed treatment for malignant neoplasm: Secondary | ICD-10-CM | POA: Diagnosis not present

## 2019-01-18 DIAGNOSIS — L821 Other seborrheic keratosis: Secondary | ICD-10-CM | POA: Diagnosis not present

## 2019-01-18 DIAGNOSIS — D2261 Melanocytic nevi of right upper limb, including shoulder: Secondary | ICD-10-CM | POA: Diagnosis not present

## 2019-01-18 DIAGNOSIS — D2271 Melanocytic nevi of right lower limb, including hip: Secondary | ICD-10-CM | POA: Diagnosis not present

## 2019-01-18 DIAGNOSIS — D2272 Melanocytic nevi of left lower limb, including hip: Secondary | ICD-10-CM | POA: Diagnosis not present

## 2019-03-24 ENCOUNTER — Encounter: Payer: Self-pay | Admitting: Family Medicine

## 2019-03-24 ENCOUNTER — Ambulatory Visit (INDEPENDENT_AMBULATORY_CARE_PROVIDER_SITE_OTHER): Payer: PPO | Admitting: Family Medicine

## 2019-03-24 ENCOUNTER — Other Ambulatory Visit: Payer: Self-pay

## 2019-03-24 VITALS — BP 110/48 | HR 58 | Temp 98.5°F | Resp 16 | Ht 62.0 in | Wt 176.0 lb

## 2019-03-24 DIAGNOSIS — R7303 Prediabetes: Secondary | ICD-10-CM

## 2019-03-24 DIAGNOSIS — Z23 Encounter for immunization: Secondary | ICD-10-CM

## 2019-03-24 DIAGNOSIS — Z1211 Encounter for screening for malignant neoplasm of colon: Secondary | ICD-10-CM | POA: Diagnosis not present

## 2019-03-24 DIAGNOSIS — E7849 Other hyperlipidemia: Secondary | ICD-10-CM | POA: Diagnosis not present

## 2019-03-24 DIAGNOSIS — Z0000 Encounter for general adult medical examination without abnormal findings: Secondary | ICD-10-CM

## 2019-03-24 DIAGNOSIS — I10 Essential (primary) hypertension: Secondary | ICD-10-CM | POA: Diagnosis not present

## 2019-03-24 DIAGNOSIS — J432 Centrilobular emphysema: Secondary | ICD-10-CM

## 2019-03-24 DIAGNOSIS — E034 Atrophy of thyroid (acquired): Secondary | ICD-10-CM | POA: Diagnosis not present

## 2019-03-24 MED ORDER — SHINGRIX 50 MCG/0.5ML IM SUSR
INTRAMUSCULAR | 1 refills | Status: DC
Start: 2019-03-24 — End: 2019-10-21

## 2019-03-24 NOTE — Patient Instructions (Addendum)
Thank you for coming to the office today.  Recent Labs    04/22/18 0802 10/27/18 0759  HGBA1C 5.7* 5.8*    Shingrix new shingles vaccine one dose now and then repeat in 2 to 6 months. - Fairly common to have immune reaction to this, can get elevated temp or fever and ache  Colon Cancer Screening: - For all adults age 71+ routine colon cancer screening is highly recommended.     - Recent guidelines from [place] recommend starting age of 39 - Early detection of colon cancer is important, because often there are no warning signs or symptoms, also if found early usually it can be cured. Late stage is hard to treat.  - If you are not interested in Colonoscopy screening (if done and normal you could be cleared for 5 to 10 years until next due), then Cologuard is an excellent alternative for screening test for Colon Cancer. It is highly sensitive for detecting DNA of colon cancer from even the earliest stages. Also, there is NO bowel prep required. - If Cologuard is NEGATIVE, then it is good for 3 years before next due - If Cologuard is POSITIVE, then it is strongly advised to get a Colonoscopy, which allows the GI doctor to locate the source of the cancer or polyp (even very early stage) and treat it by removing it. ------------------------- If you would like to proceed with Cologuard (stool DNA test) - FIRST, call your insurance company and tell them you want to check cost of Cologuard tell them CPT Code [phone] (it may be completely covered and you could get for no cost, OR max cost without any coverage is about $600). Also, keep in mind if you do NOT open the kit, and decide not to do the test, you will NOT be charged, you should contact the company if you decide not to do the test. Follow instructions to collect sample, you may call the company for any help or questions, 24/7 telephone support at [phone].     Please schedule a Follow-up Appointment to: Return in about 7  months (around 10/22/2019) for Follow-up 7 months for PreDM A1c, Thyroid.  If you have any other questions or concerns, please feel free to call the office or send a message through [place]. You may also schedule an earlier appointment if necessary.  Additionally, you may be receiving a survey about your experience at our office within a few days to 1 week by e-mail or mail. We value your feedback.  Nobie Putnam, DO [place]

## 2019-03-24 NOTE — Assessment & Plan Note (Signed)
Controlled cholesterol on statin and lifestyle Last lipid panel 10/2018  Plan: 1. Continue current meds - Atorvastatin 10mg , has refills 2. Continue ASA 325mg  for primary ASCVD risk reduction 3. Encourage improved lifestyle - low carb/cholesterol, reduce portion size, continue improving regular exercise

## 2019-03-24 NOTE — Assessment & Plan Note (Signed)
Stable without exacerbation, has had infrequent flares Clinical diagnosis in past, with chronic tobacco abuse, and emphysema changes on CXR - No formal Spirometry or PFTs  Plan: Continue Flovent, albuterol PRN Follow-up in future if need further Spirometry or PFTs, consider Pulm Counseling on tobacco cessation Future Low Dose CT

## 2019-03-24 NOTE — Assessment & Plan Note (Signed)
Well-controlled HTN - Home BP readings - normal No known complications   Plan:  1. Continue current BP regimen - HCTZ 12.5mg daily, Lisinopril 20mg daily, Metoprolol 50mg BID 2. Encourage improved lifestyle - low sodium diet, regular exercise, smoking cessation 3. Continue monitor BP outside office, bring readings to next visit, if persistently >140/90 or new symptoms notify office sooner 

## 2019-03-24 NOTE — Progress Notes (Signed)
Subjective:    Patient ID: Sarah Adkins, female    DOB: 1945-10-27, 73 y.o.   MRN: [id number]  Sarah Adkins is a 73 y.o. female presenting on 03/24/2019 for Annual Exam   HPI   Here for Annual Physical and Lab Review.  Follow-up Hypothyroidism Last lab showed normal TSH and Free T4 on current dose Synthroid 158mcg daily. She is HOLDING dose on Sundays as usual Denies worsening fatigue, weight gain, hair or nail changes, temperature changes  CHRONIC HTN: Reports no concerns. Home readings normal. Current Meds - HCTZ 12.5mg  daily, Lisinopril 20mg  dailyi, Metoprolol 50 BID   Reports good compliance, took meds today. Tolerating well, w/o complaints.  Pre-Diabetes Elevated A1c up to 5.8. No prior dx of PreDM Meds: None Currently on ACEi Lifestyle: - Diet (not always adhering to low carb diet - plans to improve)  - Exercise (now walking more up to 2.5 to 3 miles daily walking with dog) Denies hypoglycemia, polyuria, visual changes, numbness or tingling.  HYPERLIPIDEMIA: - Reports no concerns. Last lipid panel 10/2018, controlled mostly - Currently taking Atorvastatin 10mg , tolerating well without side effects or myalgias   Centrilobular Emphysema / COPD No new concerns. Prior chart reviewed. On Flovent, Albuterol.  FOLLOW-UP ELEVATED HEMOGLOBIN  Labs reviewed, stable to slight improved Hgb 16.1. No new concerns at this time. Normalized platelets. Prior smear review results in system. did not show any significant morphological abnormalities and did show some platelet clumping. Denies any easy bruising or history of blood clots. Denies fatigue, dyspnea   Health Maintenance:  Still due for routine DEXA screening for osteoporosis. She declines at this time. Had it done before, not interested in repeat.  Shingles - prior zostavax years ago, she has had shingles on scalp, causes chronic post herpetic neuralgia headaches. She is interested in new Shingrix  Colon CA  Screening: Never had colonoscopy. Currently asymptomatic. No known family history of colon CA. Due for screening test considering Cologuard, counseling given    Depression screen [facility] : [facility] 2/9 04/28/2018 10/20/2017 04/22/2017  Decreased Interest 0 0 0  Down, Depressed, Hopeless 0 0 0  PHQ - 2 Score 0 0 0    Past Medical History:  Diagnosis Date  . Hyperthyroidism   . Skin cancer of face    Past Surgical History:  Procedure Laterality Date  . BREAST BIOPSY Left 2002   bx /clip-neg  . FOOT SURGERY Left   . HAND SURGERY Bilateral   . LAPAROSCOPIC TUBAL LIGATION     Social History   Socioeconomic History  . Marital status: Widowed    Spouse name: Not on file  . Number of children: Not on file  . Years of education: Not on file  . Highest education level: Not on file  Occupational History  . Not on file  Social Needs  . Financial resource strain: Not hard at all  . Food insecurity    Worry: Never true    Inability: Never true  . Transportation needs    Medical: No    Non-medical: No  Tobacco Use  . Smoking status: Current Every Day Smoker    Packs/day: 0.25    Years: 50.00    Pack years: 12.50    Types: Cigarettes  . Smokeless tobacco: Current User  . Tobacco comment: Prior failed Chantix. Currently cutting down < 6 cigs daily, will use NRT  Substance and Sexual Activity  . Alcohol use: Yes    Alcohol/week: 0.0 standard drinks    Comment: occasional beer  .  Drug use: No  . Sexual activity: Not on file  Lifestyle  . Physical activity    Days per week: 0 days    Minutes per session: 0 min  . Stress: Not at all  Relationships  . Social connections    Talks on phone: More than three times a week    Gets together: More than three times a week    Attends religious service: Never    Active member of club or organization: No    Attends meetings of clubs or organizations: Never    Relationship status: Widowed  . Intimate partner violence    Fear of current or ex partner:  No    Emotionally abused: No    Physically abused: No    Forced sexual activity: No  Other Topics Concern  . Not on file  Social History Narrative  . Not on file   Family History  Problem Relation Age of Onset  . Stroke Mother   . Heart attack Mother   . Hypertension Mother   . Congestive Heart Failure Father   . Seizures Brother   . Breast cancer Cousin 40       mat cousin   Current Outpatient Medications on File Prior to Visit  Medication Sig  . aspirin EC 325 MG tablet Take 325 mg by mouth daily.  Marland Kitchen atorvastatin (LIPITOR) 10 MG tablet TAKE 1 TABLET(10 MG) BY MOUTH AT BEDTIME  . Cholecalciferol (VITAMIN D3) 1000 units CAPS Take 1,000 Units by mouth daily.  . fluticasone (FLOVENT HFA) 110 MCG/ACT inhaler Inhale 1 puff into the lungs 2 (two) times daily as needed.  . gabapentin (NEURONTIN) 100 MG capsule TAKE 2 CAPSULES BY MOUTH THREE TIMES DAILY  . hydrochlorothiazide (HYDRODIURIL) 12.5 MG tablet TAKE 1 TABLET(12.5 MG) BY MOUTH DAILY  . levothyroxine (SYNTHROID, LEVOTHROID) 100 MCG tablet Take 1 tablet (139mcg) daily before breakfast 6 days a week. Do not take on Sundays  . lisinopril (PRINIVIL,ZESTRIL) 20 MG tablet TAKE 1 TABLET(20 MG) BY MOUTH DAILY  . metoprolol tartrate (LOPRESSOR) 50 MG tablet TAKE 1 TABLET(50 MG) BY MOUTH TWICE DAILY  . PROAIR HFA 108 (90 Base) MCG/ACT inhaler INHALE 2 PUFFS INTO THE LUNGS EVERY 6 HOURS AS NEEDED FOR WHEEZING OR SHORTNESS OF BREATH   No current facility-administered medications on file prior to visit.     Review of Systems  Constitutional: Negative for activity change, appetite change, chills, diaphoresis, fatigue and fever.  HENT: Negative for congestion and hearing loss.   Eyes: Negative for visual disturbance.  Respiratory: Negative for apnea, cough, chest tightness, shortness of breath and wheezing.   Cardiovascular: Negative for chest pain, palpitations and leg swelling.  Gastrointestinal: Negative for abdominal pain, anal  bleeding, blood in stool, constipation, diarrhea, nausea and vomiting.  Endocrine: Negative for cold intolerance.  Genitourinary: Negative for difficulty urinating, dysuria, frequency and hematuria.  Musculoskeletal: Negative for arthralgias, back pain and neck pain.  Skin: Negative for rash.  Allergic/Immunologic: Negative for environmental allergies.  Neurological: Negative for dizziness, weakness, light-headedness, numbness and headaches.  Hematological: Negative for adenopathy.  Psychiatric/Behavioral: Negative for behavioral problems, dysphoric mood and sleep disturbance. The patient is not nervous/anxious.    Per HPI unless specifically indicated above      Objective:    BP (!) 110/48   Pulse (!) 58   Temp 98.5 F (36.9 C) (Oral)   Resp 16   Ht 5\' 2"  (1.575 m)   Wt 176 lb (79.8 kg)   BMI 32.19  kg/m   Wt Readings from Last 3 Encounters:  03/24/19 176 lb (79.8 kg)  04/28/18 172 lb (78 kg)  10/20/17 174 lb 6.4 oz (79.1 kg)    Physical Exam Vitals signs and nursing note reviewed.  Constitutional:      General: She is not in acute distress.    Appearance: She is well-developed. She is not diaphoretic.     Comments: Well-appearing, comfortable, cooperative  HENT:     Head: Normocephalic and atraumatic.  Eyes:     General:        Right eye: No discharge.        Left eye: No discharge.     Conjunctiva/sclera: Conjunctivae normal.     Pupils: Pupils are equal, round, and reactive to light.  Neck:     Musculoskeletal: Normal range of motion and neck supple.     Thyroid: No thyromegaly.     Comments: No thyromegaly or nodules Cardiovascular:     Rate and Rhythm: Normal rate and regular rhythm.     Heart sounds: Normal heart sounds. No murmur.  Pulmonary:     Effort: Pulmonary effort is normal. No respiratory distress.     Breath sounds: Normal breath sounds. No wheezing or rales.  Abdominal:     General: Bowel sounds are normal. There is no distension.      Palpations: Abdomen is soft. There is no mass.     Tenderness: There is no abdominal tenderness.  Musculoskeletal: Normal range of motion.        General: No tenderness.     Comments: Upper / Lower Extremities: - Normal muscle tone, strength bilateral upper extremities 5/5, lower extremities 5/5  Lymphadenopathy:     Cervical: No cervical adenopathy.  Skin:    General: Skin is warm and dry.     Findings: No erythema or rash.  Neurological:     Mental Status: She is alert and oriented to person, place, and time.     Comments: Distal sensation intact to light touch all extremities  Psychiatric:        Behavior: Behavior normal.     Comments: Well groomed, good eye contact, normal speech and thoughts        Results for orders placed or performed in visit on 10/27/18  T4, free  Result Value Ref Range   Free T4 1.4 0.8 - 1.8 ng/dL  TSH  Result Value Ref Range   TSH 2.66 0.40 - 4.50 mIU/L  Lipid panel  Result Value Ref Range   Cholesterol 196 <200 mg/dL   HDL 60 > OR = 50 mg/dL   Triglycerides 131 <150 mg/dL   LDL Cholesterol (Calc) 111 (H) mg/dL (calc)   Total CHOL/HDL Ratio 3.3 <5.0 (calc)   Non-HDL Cholesterol (Calc) 136 (H) <130 mg/dL (calc)  COMPLETE METABOLIC PANEL WITH GFR  Result Value Ref Range   Glucose, Bld 88 65 - 99 mg/dL   BUN 16 7 - 25 mg/dL   Creat 0.80 0.60 - 0.93 mg/dL   GFR, Est Non African American 75 > OR = 60 mL/min/1.60m2   GFR, Est African American 87 > OR = 60 mL/min/1.32m2   BUN/Creatinine Ratio NOT APPLICABLE 6 - 22 (calc)   Sodium 138 135 - 146 mmol/L   Potassium 4.0 3.5 - 5.3 mmol/L   Chloride 103 98 - 110 mmol/L   CO2 26 20 - 32 mmol/L   Calcium 9.3 8.6 - 10.4 mg/dL   Total Protein 6.4 6.1 - 8.1 g/dL  Albumin 4.1 3.6 - 5.1 g/dL   Globulin 2.3 1.9 - 3.7 g/dL (calc)   AG Ratio 1.8 1.0 - 2.5 (calc)   Total Bilirubin 1.1 0.2 - 1.2 mg/dL   Alkaline phosphatase (APISO) 96 37 - 153 U/L   AST 36 (H) 10 - 35 U/L   ALT 42 (H) 6 - 29 U/L  CBC  with Differential/Platelet  Result Value Ref Range   WBC 7.7 3.8 - 10.8 Thousand/uL   RBC 5.38 (H) 3.80 - 5.10 Million/uL   Hemoglobin 16.1 (H) 11.7 - 15.5 g/dL   HCT 48.0 (H) 35.0 - 45.0 %   MCV 89.2 80.0 - 100.0 fL   MCH 29.9 27.0 - 33.0 pg   MCHC 33.5 32.0 - 36.0 g/dL   RDW 12.9 11.0 - 15.0 %   Platelets 201 140 - 400 Thousand/uL   MPV 11.2 7.5 - 12.5 fL   Neutro Abs 4,682 1,500 - 7,800 cells/uL   Lymphs Abs 2,156 850 - 3,900 cells/uL   Absolute Monocytes 655 200 - 950 cells/uL   Eosinophils Absolute 139 15 - 500 cells/uL   Basophils Absolute 69 0 - 200 cells/uL   Neutrophils Relative % 60.8 %   Total Lymphocyte 28.0 %   Monocytes Relative 8.5 %   Eosinophils Relative 1.8 %   Basophils Relative 0.9 %  Hemoglobin A1c  Result Value Ref Range   Hgb A1c MFr Bld 5.8 (H) <5.7 % of total Hgb   Mean Plasma Glucose 120 (calc)   eAG (mmol/L) 6.6 (calc)      Assessment & Plan:   Problem List Items Addressed This Visit    Centrilobular emphysema (HCC)    Stable without exacerbation, has had infrequent flares Clinical diagnosis in past, with chronic tobacco abuse, and emphysema changes on CXR - No formal Spirometry or PFTs  Plan: Continue Flovent, albuterol PRN Follow-up in future if need further Spirometry or PFTs, consider Pulm Counseling on tobacco cessation Future Low Dose CT      Essential hypertension    Well-controlled HTN - Home BP readings - normal No known complications   Plan:  1. Continue current BP regimen - HCTZ 12.5mg  daily, Lisinopril 20mg  daily, Metoprolol 50mg  BID 2. Encourage improved lifestyle - low sodium diet, regular exercise, smoking cessation 3. Continue monitor BP outside office, bring readings to next visit, if persistently >140/90 or new symptoms notify office sooner      Hyperlipidemia    Controlled cholesterol on statin and lifestyle Last lipid panel 10/2018  Plan: 1. Continue current meds - Atorvastatin 10mg , has refills 2. Continue ASA  325mg  for primary ASCVD risk reduction 3. Encourage improved lifestyle - low carb/cholesterol, reduce portion size, continue improving regular exercise      Hypothyroidism    Controlled on current dose Levothyroxine Last TSH, Free T4 normal  Plan Continue Levothyroxine 179mcg daily except Sundays      Pre-diabetes    Slightly elevated A1c up to 5.8  Plan:  1. Not on any therapy currently  2. Encourage improved lifestyle - low carb, low sugar diet, reduce portion size, continue improving regular exercise       Other Visit Diagnoses    Annual physical exam    -  Primary   Screen for colon cancer       Relevant Orders   Cologuard   Need for shingles vaccine       Relevant Medications   SHINGRIX injection      Updated Health Maintenance information -  rx for shingrix, she had prior zostavax years ago, had shingles - No prior colonoscopy, agrees to try cologuard, and will consider colonoscopy if positive. Ordered. Reviewed recent lab results with patient Encouraged improvement to lifestyle with diet and exercise   Meds ordered this encounter  Medications  . SHINGRIX injection    Sig: Inject 0.53mLs into the muscle for 1 dose, then repeat in 2 to 6 months.    Dispense:  0.5 mL    Refill:  1     Follow up plan: Return in about 7 months (around 10/22/2019) for Follow-up 7 months for PreDM A1c, Thyroid.  Nobie Putnam, [facility] 03/24/2019, 9:19 AM

## 2019-03-24 NOTE — Assessment & Plan Note (Signed)
Slightly elevated A1c up to 5.8  Plan:  1. Not on any therapy currently  2. Encourage improved lifestyle - low carb, low sugar diet, reduce portion size, continue improving regular exercise

## 2019-03-24 NOTE — Assessment & Plan Note (Signed)
Controlled on current dose Levothyroxine Last TSH, Free T4 normal  Plan Continue Levothyroxine 100mcg daily except Sundays 

## 2019-05-25 ENCOUNTER — Other Ambulatory Visit: Payer: Self-pay | Admitting: Family Medicine

## 2019-05-25 DIAGNOSIS — E782 Mixed hyperlipidemia: Secondary | ICD-10-CM

## 2019-05-25 DIAGNOSIS — I10 Essential (primary) hypertension: Secondary | ICD-10-CM

## 2019-05-26 ENCOUNTER — Other Ambulatory Visit: Payer: Self-pay | Admitting: Family Medicine

## 2019-05-26 DIAGNOSIS — E034 Atrophy of thyroid (acquired): Secondary | ICD-10-CM

## 2019-05-26 MED ORDER — LEVOTHYROXINE SODIUM 100 MCG PO TABS
ORAL_TABLET | ORAL | 3 refills | Status: DC
Start: 2019-05-26 — End: 2020-02-22

## 2019-07-28 ENCOUNTER — Telehealth: Payer: Self-pay | Admitting: Family Medicine

## 2019-07-28 NOTE — Chronic Care Management (AMB) (Signed)
  Chronic Care Management   Outreach Note  07/28/2019 Name: Sarah Adkins MRN: [id number] DOB: November 26, 1945  Referred by: Olin Hauser, DO Reason for referral : Chronic Care Management (Initial CCM outreach was unsuccessful. )   An unsuccessful telephone outreach was attempted today. The patient was referred to the case management team by for assistance with care management and care coordination.   Follow Up Plan: A HIPPA compliant phone message was left for the patient providing contact information and requesting a return call.  The care management team will reach out to the patient again over the next 7 days.  If patient returns call to provider office, please advise to call [place] at [place], [place] Management  [place], [place] [address] Direct Dial: [email or web address]  Website: [email or web address]

## 2019-08-02 NOTE — Chronic Care Management (AMB) (Signed)
  Chronic Care Management   Outreach Note  08/02/2019 Name: Sarah Adkins MRN: [id number] DOB: May 13, 1946  Referred by: Olin Hauser, DO Reason for referral : Chronic Care Management (Initial CCM outreach was unsuccessful. ) and Chronic Care Management (Second CCM outreach was unsuccessful.)   A second unsuccessful telephone outreach was attempted today. The patient was referred to the case management team for assistance with care management and care coordination.   Follow Up Plan: A HIPPA compliant phone message was left for the patient providing contact information and requesting a return call.  The care management team will reach out to the patient again over the next 7 days.  If patient returns call to provider office, please advise to call [place] at [place], [place] [facility]  [place], [place] [address] Direct Dial: [email or web address]  Website: [email or web address]

## 2019-08-10 NOTE — Chronic Care Management (AMB) (Signed)
°  Chronic Care Management   Outreach Note  08/10/2019 Name: Sarah Adkins MRN: [id number] DOB: 1946-05-02  Referred by: Olin Hauser, DO Reason for referral : Chronic Care Management (Initial CCM outreach was unsuccessful. ), Chronic Care Management (Second CCM outreach was unsuccessful.), and Chronic Care Management (Third CCM outreach was unsuccessful.)   Third unsuccessful telephone outreach was attempted today. The patient was referred to the case management team for assistance with care management and care coordination. The patient's primary care provider has been notified of our unsuccessful attempts to make or maintain contact with the patient. The care management team is pleased to engage with this patient at any time in the future should he/she be interested in assistance from the care management team.   Follow Up Plan: A HIPPA compliant phone message was left for the patient providing contact information and requesting a return call.  The care management team will reach out to the patient again over the next 7 days.  If patient returns call to provider office, please advise to call [place] at [place], [place] Management  [place], [place] [address] Direct Dial: [email or web address]  Website: [email or web address]

## 2019-08-22 ENCOUNTER — Other Ambulatory Visit: Payer: Self-pay | Admitting: Family Medicine

## 2019-08-22 DIAGNOSIS — J3089 Other allergic rhinitis: Secondary | ICD-10-CM

## 2019-08-22 DIAGNOSIS — B0229 Other postherpetic nervous system involvement: Secondary | ICD-10-CM

## 2019-10-21 ENCOUNTER — Other Ambulatory Visit: Payer: Self-pay

## 2019-10-21 ENCOUNTER — Other Ambulatory Visit: Payer: Self-pay | Admitting: Family Medicine

## 2019-10-21 ENCOUNTER — Ambulatory Visit (INDEPENDENT_AMBULATORY_CARE_PROVIDER_SITE_OTHER): Payer: PPO | Admitting: Family Medicine

## 2019-10-21 ENCOUNTER — Encounter: Payer: Self-pay | Admitting: Family Medicine

## 2019-10-21 VITALS — BP 126/57 | HR 55 | Temp 97.5°F | Resp 16 | Ht 62.0 in | Wt 172.0 lb

## 2019-10-21 DIAGNOSIS — I10 Essential (primary) hypertension: Secondary | ICD-10-CM | POA: Diagnosis not present

## 2019-10-21 DIAGNOSIS — J432 Centrilobular emphysema: Secondary | ICD-10-CM

## 2019-10-21 DIAGNOSIS — R7303 Prediabetes: Secondary | ICD-10-CM | POA: Diagnosis not present

## 2019-10-21 DIAGNOSIS — D582 Other hemoglobinopathies: Secondary | ICD-10-CM

## 2019-10-21 DIAGNOSIS — E034 Atrophy of thyroid (acquired): Secondary | ICD-10-CM

## 2019-10-21 DIAGNOSIS — Z0000 Encounter for general adult medical examination without abnormal findings: Secondary | ICD-10-CM

## 2019-10-21 DIAGNOSIS — E7849 Other hyperlipidemia: Secondary | ICD-10-CM

## 2019-10-21 LAB — POCT GLYCOSYLATED HEMOGLOBIN (HGB A1C): Hemoglobin A1C: 5.8 % — AB (ref 4.0–5.6)

## 2019-10-21 NOTE — Patient Instructions (Addendum)
Thank you for coming to the office today.  You can travel to [place] if you like. Use travel precautions as discussed.  Recent Labs    10/27/18 0759 10/21/19 0940  HGBA1C 5.8* 5.8*   Weight down 4 lbs. Keep up the good work. In future can review wt loss medication options if becomes a problem or higher sugar.  Shingrix vaccine at least 2 to 4 weeks AFTER 2nd dose covid.  DUE for FASTING BLOOD WORK (no food or drink after midnight before the lab appointment, only water or coffee without cream/sugar on the morning of)  SCHEDULE "Lab Only" visit in the morning at the clinic for lab draw in 6 MONTHS   - Make sure Lab Only appointment is at about 1 week before your next appointment, so that results will be available  For Lab Results, once available within 2-3 days of blood draw, you can can log in to MyChart online to view your results and a brief explanation. Also, we can discuss results at next follow-up visit.   Please schedule a Follow-up Appointment to: Return in about 6 months (around 04/22/2020) for Annual Physical.  If you have any other questions or concerns, please feel free to call the office or send a message through [place]. You may also schedule an earlier appointment if necessary.  Additionally, you may be receiving a survey about your experience at our office within a few days to 1 week by e-mail or mail. We value your feedback.  Nobie Putnam, DO [place]

## 2019-10-21 NOTE — Assessment & Plan Note (Signed)
Well-controlled HTN - Home BP readings - normal No known complications   Plan:  1. Continue current BP regimen - HCTZ 12.5mg daily, Lisinopril 20mg daily, Metoprolol 50mg BID 2. Encourage improved lifestyle - low sodium diet, regular exercise, smoking cessation 3. Continue monitor BP outside office, bring readings to next visit, if persistently >140/90 or new symptoms notify office sooner 

## 2019-10-21 NOTE — Assessment & Plan Note (Signed)
Stable elevated A1c up to 5.8  Plan:  1. Not on any therapy currently  2. Encourage improved lifestyle - low carb, low sugar diet, reduce portion size, continue improving regular exercise

## 2019-10-21 NOTE — Assessment & Plan Note (Signed)
Stable without exacerbation, has had infrequent flares °Clinical diagnosis in past, with chronic tobacco abuse, and emphysema changes on CXR °- No formal Spirometry or PFTs ° °Plan: °Continue Flovent, albuterol PRN °Follow-up in future if need further Spirometry or PFTs, consider Pulm °Counseling on tobacco cessation °

## 2019-10-21 NOTE — Progress Notes (Signed)
Subjective:    Patient ID: Sarah Adkins, female    DOB: 15-Oct-1945, 74 y.o.   MRN: [id number]  Sarah Adkins is a 74 y.o. female presenting on 10/21/2019 for Hypothyroidism   HPI   Follow-upHypothyroidism Previous labs last time, showed normal TSH and Free T4 on current dose Synthroid 138mg daily. She is HOLDING dose on Sundays as usual Denies worsening fatigue, weight gain, hair or nail changes, temperature changes  CHRONIC HTN: Reports no concerns. Home readings normal. Current Meds - HCTZ 12.567mdaily, Lisinopril 2069maily, Metoprolol 50 BID   Reports good compliance, took meds today. Tolerating well, w/o complaints.  Pre-Diabetes Elevated A1c stable at 5.8 Meds: None Currently on ACEi Lifestyle: - Diet (improving diet, reduce portions) - Exercise (now walking more) Denies hypoglycemia, polyuria, visual changes, numbness or tingling.  Centrilobular Emphysema / COPD No new concerns. Prior chart reviewed. On Flovent, Albuterol.   Health Maintenance:  Still due for routine DEXA screening for osteoporosis. She declines at this time. Had it done before, not interested in repeat.  Shingles - prior zostavax years ago, she has had shingles on scalp, causes chronic post herpetic neuralgia headaches. She is interested in new Shingrix - she will get this in 1 month.  Colon CA Screening: Never had colonoscopy. Currently asymptomatic. No known family history of colon CA. Due for screening test considering Cologuard, counseling given. She has it at home still thinking about it. Has the kit but has not sent it in yet.   [facility] 04/28/2018 10/20/2017  Decreased Interest 0 0 0  Down, Depressed, Hopeless 0 0 0  PHQ - 2 Score 0 0 0    Social History   Tobacco Use  . Smoking status: Current Every Day Smoker    Packs/day: 0.25    Years: 50.00    Pack years: 12.50    Types: Cigarettes  . Smokeless tobacco: Current User  . Tobacco comment:  Prior failed Chantix. Currently cutting down < 6 cigs daily, will use NRT  Substance Use Topics  . Alcohol use: Yes    Alcohol/week: 0.0 standard drinks    Comment: occasional beer  . Drug use: No    Review of Systems Per HPI unless specifically indicated above     Objective:    BP (!) 126/57   Pulse (!) 55   Temp (!) 97.5 F (36.4 C) (Temporal)   Resp 16   Ht '5\' 2"'  (1.575 m)   Wt 172 lb (78 kg)   BMI 31.46 kg/m   Wt Readings from Last 3 Encounters:  10/21/19 172 lb (78 kg)  03/24/19 176 lb (79.8 kg)  04/28/18 172 lb (78 kg)    Physical Exam Vitals and nursing note reviewed.  Constitutional:      General: She is not in acute distress.    Appearance: She is well-developed. She is not diaphoretic.     Comments: Well-appearing, comfortable, cooperative  HENT:     Head: Normocephalic and atraumatic.  Eyes:     General:        Right eye: No discharge.        Left eye: No discharge.     Conjunctiva/sclera: Conjunctivae normal.  Neck:     Thyroid: No thyromegaly.  Cardiovascular:     Rate and Rhythm: Normal rate and regular rhythm.     Heart sounds: Normal heart sounds. No murmur.  Pulmonary:     Effort: Pulmonary effort is normal. No respiratory distress.  Breath sounds: Normal breath sounds. No wheezing or rales.  Musculoskeletal:        General: Normal range of motion.     Cervical back: Normal range of motion and neck supple.     Right lower leg: Edema (+1 non pitting edema, mild, spider veins varicose veins) present.     Left lower leg: Edema present.  Lymphadenopathy:     Cervical: No cervical adenopathy.  Skin:    General: Skin is warm and dry.     Findings: No erythema or rash.  Neurological:     Mental Status: She is alert and oriented to person, place, and time.  Psychiatric:        Behavior: Behavior normal.     Comments: Well groomed, good eye contact, normal speech and thoughts       Results for orders placed or performed in visit on  10/21/19  POCT HgB A1C  Result Value Ref Range   Hemoglobin A1C 5.8 (A) 4.0 - 5.6 %      Assessment & Plan:   Problem List Items Addressed This Visit    Pre-diabetes - Primary    Stable elevated A1c up to 5.8  Plan:  1. Not on any therapy currently  2. Encourage improved lifestyle - low carb, low sugar diet, reduce portion size, continue improving regular exercise      Relevant Orders   POCT HgB A1C (Completed)   Hypothyroidism    Controlled on current dose Levothyroxine Last TSH, Free T4 normal  Plan Continue Levothyroxine 144mg daily except Sundays      Essential hypertension    Well-controlled HTN - Home BP readings - normal No known complications   Plan:  1. Continue current BP regimen - HCTZ 12.560mdaily, Lisinopril 2018maily, Metoprolol 74m67mD 2. Encourage improved lifestyle - low sodium diet, regular exercise, smoking cessation 3. Continue monitor BP outside office, bring readings to next visit, if persistently >140/90 or new symptoms notify office sooner      Centrilobular emphysema (HCC)[place] Stable without exacerbation, has had infrequent flares Clinical diagnosis in past, with chronic tobacco abuse, and emphysema changes on CXR - No formal Spirometry or PFTs  Plan: Continue Flovent, albuterol PRN Follow-up in future if need further Spirometry or PFTs, consider Pulm Counseling on tobacco cessation          No orders of the defined types were placed in this encounter.    Follow up plan: Return in about 6 months (around 04/22/2020) for Annual Physical.  Future labs ordered for 04/19/20   AlexNobie Putnam [facility] 10/21/2019, 9:39 AM

## 2019-10-21 NOTE — Assessment & Plan Note (Signed)
Controlled on current dose Levothyroxine Last TSH, Free T4 normal  Plan Continue Levothyroxine 100mcg daily except Sundays 

## 2019-11-08 ENCOUNTER — Telehealth: Payer: Self-pay

## 2019-11-18 ENCOUNTER — Other Ambulatory Visit: Payer: Self-pay | Admitting: Family Medicine

## 2019-11-18 DIAGNOSIS — I10 Essential (primary) hypertension: Secondary | ICD-10-CM

## 2020-01-03 ENCOUNTER — Other Ambulatory Visit: Payer: Self-pay | Admitting: Family Medicine

## 2020-01-03 DIAGNOSIS — I10 Essential (primary) hypertension: Secondary | ICD-10-CM

## 2020-01-03 NOTE — Telephone Encounter (Signed)
Requested Prescriptions  Pending Prescriptions Disp Refills  . hydrochlorothiazide (HYDRODIURIL) 12.5 MG tablet [Pharmacy Med Name: HYDROCHLOROTHIAZIDE 12.5MG  TABLETS] 90 tablet 3    Sig: TAKE 1 TABLET(12.5 MG) BY MOUTH DAILY     Cardiovascular: Diuretics - Thiazide Failed - 01/03/2020  8:43 AM      Failed - Ca in normal range and within 360 days    Calcium  Date Value Ref Range Status  10/27/2018 9.3 8.6 - 10.4 mg/dL Final         Failed - Cr in normal range and within 360 days    Creat  Date Value Ref Range Status  10/27/2018 0.80 0.60 - 0.93 mg/dL Final    Comment:    For patients >31 years of age, the reference limit for Creatinine is approximately 13% higher for people identified as African-American. .          Failed - K in normal range and within 360 days    Potassium  Date Value Ref Range Status  10/27/2018 4.0 3.5 - 5.3 mmol/L Final         Failed - Na in normal range and within 360 days    Sodium  Date Value Ref Range Status  10/27/2018 138 135 - 146 mmol/L Final  11/12/2015 140 134 - 144 mmol/L Final         Passed - Last BP in normal range    BP Readings from Last 1 Encounters:  10/21/19 (!) 126/57         Passed - Valid encounter within last 6 months    Recent Outpatient Visits          2 months ago Pre-diabetes   [place], DO   9 months ago Annual physical exam   [place], DO   1 year ago Hypothyroidism due to acquired atrophy of thyroid   [place], Devonne Doughty, DO   2 years ago Annual physical exam   [facility] [place], Devonne Doughty, DO   2 years ago Hypothyroidism due to acquired atrophy of thyroid   [place], Devonne Doughty, DO

## 2020-01-17 DIAGNOSIS — D225 Melanocytic nevi of trunk: Secondary | ICD-10-CM | POA: Diagnosis not present

## 2020-01-17 DIAGNOSIS — D2262 Melanocytic nevi of left upper limb, including shoulder: Secondary | ICD-10-CM | POA: Diagnosis not present

## 2020-01-17 DIAGNOSIS — L821 Other seborrheic keratosis: Secondary | ICD-10-CM | POA: Diagnosis not present

## 2020-01-17 DIAGNOSIS — Z85828 Personal history of other malignant neoplasm of skin: Secondary | ICD-10-CM | POA: Diagnosis not present

## 2020-01-17 DIAGNOSIS — D2272 Melanocytic nevi of left lower limb, including hip: Secondary | ICD-10-CM | POA: Diagnosis not present

## 2020-01-17 DIAGNOSIS — D2261 Melanocytic nevi of right upper limb, including shoulder: Secondary | ICD-10-CM | POA: Diagnosis not present

## 2020-02-22 ENCOUNTER — Other Ambulatory Visit: Payer: Self-pay | Admitting: Family Medicine

## 2020-02-22 DIAGNOSIS — E034 Atrophy of thyroid (acquired): Secondary | ICD-10-CM

## 2020-03-29 ENCOUNTER — Other Ambulatory Visit: Payer: Self-pay | Admitting: Family Medicine

## 2020-03-29 DIAGNOSIS — B0229 Other postherpetic nervous system involvement: Secondary | ICD-10-CM

## 2020-03-29 NOTE — Telephone Encounter (Signed)
Requested Prescriptions  Pending Prescriptions Disp Refills   gabapentin (NEURONTIN) 100 MG capsule [Pharmacy Med Name: GABAPENTIN 100MG  CAPSULES] 540 capsule 0    Sig: TAKE 2 CAPSULES BY MOUTH THREE TIMES DAILY     Neurology: Anticonvulsants - gabapentin Passed - 03/29/2020  3:30 PM      Passed - Valid encounter within last 12 months    Recent Outpatient Visits          5 months ago Pre-diabetes   [place], Devonne Doughty, DO   1 year ago Annual physical exam   [place], DO   1 year ago Hypothyroidism due to acquired atrophy of thyroid   [place], Devonne Doughty, DO   2 years ago Annual physical exam   [facility] [place], Devonne Doughty, DO   2 years ago Hypothyroidism due to acquired atrophy of thyroid   [place], DO      Future Appointments            In 3 weeks [place], Devonne Doughty, [facility], [facility]

## 2020-04-18 ENCOUNTER — Other Ambulatory Visit: Payer: Self-pay | Admitting: *Deleted

## 2020-04-18 DIAGNOSIS — D582 Other hemoglobinopathies: Secondary | ICD-10-CM

## 2020-04-18 DIAGNOSIS — I10 Essential (primary) hypertension: Secondary | ICD-10-CM

## 2020-04-18 DIAGNOSIS — E034 Atrophy of thyroid (acquired): Secondary | ICD-10-CM

## 2020-04-18 DIAGNOSIS — Z0000 Encounter for general adult medical examination without abnormal findings: Secondary | ICD-10-CM

## 2020-04-18 DIAGNOSIS — E7849 Other hyperlipidemia: Secondary | ICD-10-CM

## 2020-04-18 DIAGNOSIS — R7303 Prediabetes: Secondary | ICD-10-CM

## 2020-04-19 ENCOUNTER — Other Ambulatory Visit: Payer: PPO

## 2020-04-19 ENCOUNTER — Other Ambulatory Visit: Payer: Self-pay

## 2020-04-19 DIAGNOSIS — E7849 Other hyperlipidemia: Secondary | ICD-10-CM | POA: Diagnosis not present

## 2020-04-19 DIAGNOSIS — Z0000 Encounter for general adult medical examination without abnormal findings: Secondary | ICD-10-CM | POA: Diagnosis not present

## 2020-04-19 DIAGNOSIS — I10 Essential (primary) hypertension: Secondary | ICD-10-CM | POA: Diagnosis not present

## 2020-04-19 DIAGNOSIS — D582 Other hemoglobinopathies: Secondary | ICD-10-CM | POA: Diagnosis not present

## 2020-04-19 DIAGNOSIS — R7303 Prediabetes: Secondary | ICD-10-CM | POA: Diagnosis not present

## 2020-04-19 DIAGNOSIS — E034 Atrophy of thyroid (acquired): Secondary | ICD-10-CM | POA: Diagnosis not present

## 2020-04-20 LAB — CBC WITH DIFFERENTIAL/PLATELET
Absolute Monocytes: 632 cells/uL (ref 200–950)
Basophils Absolute: 57 cells/uL (ref 0–200)
Basophils Relative: 0.7 %
Eosinophils Absolute: 97 cells/uL (ref 15–500)
Eosinophils Relative: 1.2 %
HCT: 49.6 % — ABNORMAL HIGH (ref 35.0–45.0)
Hemoglobin: 16.3 g/dL — ABNORMAL HIGH (ref 11.7–15.5)
Lymphs Abs: 1685 cells/uL (ref 850–3900)
MCH: 29.4 pg (ref 27.0–33.0)
MCHC: 32.9 g/dL (ref 32.0–36.0)
MCV: 89.4 fL (ref 80.0–100.0)
MPV: 10.6 fL (ref 7.5–12.5)
Monocytes Relative: 7.8 %
Neutro Abs: 5630 cells/uL (ref 1500–7800)
Neutrophils Relative %: 69.5 %
Platelets: 202 10*3/uL (ref 140–400)
RBC: 5.55 10*6/uL — ABNORMAL HIGH (ref 3.80–5.10)
RDW: 12.9 % (ref 11.0–15.0)
Total Lymphocyte: 20.8 %
WBC: 8.1 10*3/uL (ref 3.8–10.8)

## 2020-04-20 LAB — COMPLETE METABOLIC PANEL WITH GFR
AG Ratio: 1.7 (calc) (ref 1.0–2.5)
ALT: 27 U/L (ref 6–29)
AST: 29 U/L (ref 10–35)
Albumin: 4.2 g/dL (ref 3.6–5.1)
Alkaline phosphatase (APISO): 91 U/L (ref 37–153)
BUN: 18 mg/dL (ref 7–25)
CO2: 24 mmol/L (ref 20–32)
Calcium: 9.6 mg/dL (ref 8.6–10.4)
Chloride: 104 mmol/L (ref 98–110)
Creat: 0.69 mg/dL (ref 0.60–0.93)
GFR, Est African American: 101 mL/min/{1.73_m2} (ref >=60)
GFR, Est Non African American: 87 mL/min/{1.73_m2} (ref >=60)
Globulin: 2.5 g/dL (calc) (ref 1.9–3.7)
Glucose, Bld: 90 mg/dL (ref 65–99)
Potassium: 4.1 mmol/L (ref 3.5–5.3)
Sodium: 140 mmol/L (ref 135–146)
Total Bilirubin: 1.1 mg/dL (ref 0.2–1.2)
Total Protein: 6.7 g/dL (ref 6.1–8.1)

## 2020-04-20 LAB — LIPID PANEL
Cholesterol: 206 mg/dL — ABNORMAL HIGH (ref <200)
HDL: 68 mg/dL (ref >=50)
LDL Cholesterol (Calc): 116 mg/dL (calc) — ABNORMAL HIGH
Non-HDL Cholesterol (Calc): 138 mg/dL (calc) — ABNORMAL HIGH (ref <130)
Total CHOL/HDL Ratio: 3 (calc) (ref <5.0)
Triglycerides: 113 mg/dL (ref <150)

## 2020-04-20 LAB — HEMOGLOBIN A1C
Hgb A1c MFr Bld: 5.6 % of total Hgb (ref <5.7)
Mean Plasma Glucose: 114 (calc)
eAG (mmol/L): 6.3 (calc)

## 2020-04-20 LAB — T4, FREE: Free T4: 1.7 ng/dL (ref 0.8–1.8)

## 2020-04-20 LAB — TSH: TSH: 1.78 mIU/L (ref 0.40–4.50)

## 2020-04-25 ENCOUNTER — Ambulatory Visit (INDEPENDENT_AMBULATORY_CARE_PROVIDER_SITE_OTHER): Payer: PPO | Admitting: Family Medicine

## 2020-04-25 ENCOUNTER — Other Ambulatory Visit: Payer: Self-pay

## 2020-04-25 ENCOUNTER — Encounter: Payer: Self-pay | Admitting: Family Medicine

## 2020-04-25 VITALS — BP 128/53 | HR 56 | Temp 97.5°F | Resp 16 | Ht 62.0 in | Wt 162.0 lb

## 2020-04-25 DIAGNOSIS — J432 Centrilobular emphysema: Secondary | ICD-10-CM | POA: Diagnosis not present

## 2020-04-25 DIAGNOSIS — Z720 Tobacco use: Secondary | ICD-10-CM

## 2020-04-25 DIAGNOSIS — E034 Atrophy of thyroid (acquired): Secondary | ICD-10-CM

## 2020-04-25 DIAGNOSIS — I10 Essential (primary) hypertension: Secondary | ICD-10-CM | POA: Diagnosis not present

## 2020-04-25 DIAGNOSIS — E7849 Other hyperlipidemia: Secondary | ICD-10-CM

## 2020-04-25 DIAGNOSIS — Z0000 Encounter for general adult medical examination without abnormal findings: Secondary | ICD-10-CM

## 2020-04-25 DIAGNOSIS — R7303 Prediabetes: Secondary | ICD-10-CM

## 2020-04-25 DIAGNOSIS — Z23 Encounter for immunization: Secondary | ICD-10-CM

## 2020-04-25 DIAGNOSIS — Z1231 Encounter for screening mammogram for malignant neoplasm of breast: Secondary | ICD-10-CM

## 2020-04-25 MED ORDER — SHINGRIX 50 MCG/0.5ML IM SUSR: INTRAMUSCULAR | 1 refills | Status: DC

## 2020-04-25 MED ORDER — SHINGRIX 50 MCG/0.5ML IM SUSR: INTRAMUSCULAR | 0 refills | Status: DC

## 2020-04-25 NOTE — Assessment & Plan Note (Signed)
Well-controlled HTN - Home BP readings - normal No known complications   Plan:  1. Continue current BP regimen - HCTZ 12.5mg daily, Lisinopril 20mg daily, Metoprolol 50mg BID 2. Encourage improved lifestyle - low sodium diet, regular exercise, smoking cessation 3. Continue monitor BP outside office, bring readings to next visit, if persistently >140/90 or new symptoms notify office sooner 

## 2020-04-25 NOTE — Assessment & Plan Note (Signed)
Controlled on current dose Levothyroxine Last TSH, Free T4 normal  Plan Continue Levothyroxine 129mcg daily except Sundays

## 2020-04-25 NOTE — Progress Notes (Signed)
Subjective:    Patient ID: Sarah Adkins, female    DOB: 11-Oct-1945, 74 y.o.   MRN: [id number]  Brenlyn Beshara is a 74 y.o. female presenting on 04/25/2020 for Annual Exam   HPI   Here for Annual Physical and Lab Review.  Follow-upHypothyroidism Previous labs last time, showed normal TSH and Free T4 on current dose Synthroid 191mg daily. SheisHOLDING dose on Sundays as usual Denies worsening fatigue, weight gain, hair or nail changes, temperature changes  CHRONIC HTN: Reportsno concerns. Home readings normal. Current Meds -HCTZ 12.514mdaily, Lisinopril 2076maily, Metoprolol 50 BID Reports good compliance, took meds today. Tolerating well, w/o complaints.  HYPERLIPIDEMIA: - Reports no concerns. Last lipid panel 04/2020, controlled mostly on lipid - Currently taking Atorvastatin 28m24molerating well without side effects or myalgias  Pre-Diabetes BMI >29 A1c improved to 5.6 now Meds: None Currently on ACEi Lifestyle: - Weight down 10 lbs in 6 months - Diet (improving diet, reduce portions) - Exercise (walking, more active caring for grandchild) Denies hypoglycemia, polyuria, visual changes, numbness or tingling.  Centrilobular Emphysema / COPD No new concerns. On Flovent, Albuterol.  FOLLOW-UP ELEVATED HEMOGLOBIN  Labs reviewed, stable Hgb 16.1 to 16.3 No new concerns at this time. Normalized platelets. Prior smear review results in system. Denies any easy bruising or history of blood clots. Denies fatigue, dyspnea  Health Maintenance:  Due for Flu Shot, will receive today   Still due for routine DEXA screening for osteoporosis.She declines at this time. Had it done before, not interested in repeat.  Shingles -prior zostavax years ago, she has had shingles on scalp, causes chronic post herpetic neuralgia headaches. She is interested in new Shingrix - she will get this in 1 month. - Re order Shingrix vaccine. Printed.  Colon CA Screening: Never had  colonoscopy. Currently asymptomatic. No known family history of colon CA. Due for screening test considering Cologuard, counseling given. She has it at home still thinking about it. Has the kit but has not sent it in yet. - She is considering this but again declines for now. She is not interested to know her colon cancer screening results and will defer this test.    Depression screen PHQ [facility] 04/25/2020 10/21/2019 04/28/2018  Decreased Interest 0 0 0  Down, Depressed, Hopeless 0 0 0  PHQ - 2 Score 0 0 0    Past Medical History:  Diagnosis Date  . Hyperthyroidism   . Skin cancer of face    Past Surgical History:  Procedure Laterality Date  . BREAST BIOPSY Left 2002   bx /clip-neg  . FOOT SURGERY Left   . HAND SURGERY Bilateral   . LAPAROSCOPIC TUBAL LIGATION     Social History   Socioeconomic History  . Marital status: Widowed    Spouse name: Not on file  . Number of children: Not on file  . Years of education: Not on file  . Highest education level: Not on file  Occupational History  . Not on file  Tobacco Use  . Smoking status: Current Every Day Smoker    Packs/day: 0.25    Years: 50.00    Pack years: 12.50    Types: Cigarettes  . Smokeless tobacco: Current User  . Tobacco comment: Prior failed Chantix. Currently cutting down < 6 cigs daily, will use NRT  Vaping Use  . Vaping Use: Never used  Substance and Sexual Activity  . Alcohol use: Yes    Alcohol/week: 0.0 standard drinks    Comment: occasional  beer  . Drug use: No  . Sexual activity: Not on file  Other Topics Concern  . Not on file  Social History Narrative  . Not on file   Social Determinants of Health   Financial Resource Strain:   . Difficulty of Paying Living Expenses: Not on file  Food Insecurity:   . Worried About Charity fundraiser in the Last Year: Not on file  . Ran Out of Food in the Last Year: Not on file  Transportation Needs:   . Lack of Transportation (Medical): Not on file  . Lack  of Transportation (Non-Medical): Not on file  Physical Activity:   . Days of Exercise per Week: Not on file  . Minutes of Exercise per Session: Not on file  Stress:   . Feeling of Stress : Not on file  Social Connections:   . Frequency of Communication with Friends and Family: Not on file  . Frequency of Social Gatherings with Friends and Family: Not on file  . Attends Religious Services: Not on file  . Active Member of Clubs or Organizations: Not on file  . Attends Archivist Meetings: Not on file  . Marital Status: Not on file  Intimate Partner Violence:   . Fear of Current or Ex-Partner: Not on file  . Emotionally Abused: Not on file  . Physically Abused: Not on file  . Sexually Abused: Not on file   Family History  Problem Relation Age of Onset  . Stroke Mother   . Heart attack Mother   . Hypertension Mother   . Congestive Heart Failure Father   . Seizures Brother   . Breast cancer Cousin 55       mat cousin   Current Outpatient Medications on File Prior to Visit  Medication Sig  . aspirin EC 325 MG tablet Take 325 mg by mouth daily.  Marland Kitchen atorvastatin (LIPITOR) 10 MG tablet TAKE 1 TABLET(10 MG) BY MOUTH AT BEDTIME  . Cholecalciferol (VITAMIN D3) 1000 units CAPS Take 1,000 Units by mouth daily.  Marland Kitchen FLOVENT HFA 110 MCG/ACT inhaler INHALE 1 PUFF INTO THE LUNGS TWICE DAILY AS NEEDED  . gabapentin (NEURONTIN) 100 MG capsule TAKE 2 CAPSULES BY MOUTH THREE TIMES DAILY  . hydrochlorothiazide (HYDRODIURIL) 12.5 MG tablet TAKE 1 TABLET(12.5 MG) BY MOUTH DAILY  . levothyroxine (SYNTHROID) 100 MCG tablet TAKE 1 TABLET BY MOUTH DAILY BEFORE BREAKFAST 6 DAYS PER WEEK. DO NOT TAKE ON SUNDAYS  . lisinopril (ZESTRIL) 20 MG tablet TAKE 1 TABLET(20 MG) BY MOUTH DAILY  . metoprolol tartrate (LOPRESSOR) 50 MG tablet TAKE 1 TABLET(50 MG) BY MOUTH TWICE DAILY  . PROAIR HFA 108 (90 Base) MCG/ACT inhaler INHALE 2 PUFFS INTO THE LUNGS EVERY 6 HOURS AS NEEDED FOR WHEEZING OR SHORTNESS OF  BREATH   No current facility-administered medications on file prior to visit.    Review of Systems  Constitutional: Negative for activity change, appetite change, chills, diaphoresis, fatigue and fever.  HENT: Negative for congestion and hearing loss.   Eyes: Negative for visual disturbance.  Respiratory: Negative for cough, chest tightness, shortness of breath and wheezing.   Cardiovascular: Negative for chest pain, palpitations and leg swelling.  Gastrointestinal: Negative for abdominal pain, constipation, diarrhea, nausea and vomiting.  Endocrine: Negative for cold intolerance.  Genitourinary: Negative for dysuria, frequency and hematuria.  Musculoskeletal: Negative for arthralgias and neck pain.  Skin: Negative for rash.  Allergic/Immunologic: Negative for environmental allergies.  Neurological: Negative for dizziness, weakness, light-headedness, numbness and  headaches.  Hematological: Negative for adenopathy.  Psychiatric/Behavioral: Negative for behavioral problems, dysphoric mood and sleep disturbance.   Per HPI unless specifically indicated above      Objective:    BP (!) 128/53   Pulse (!) 56   Temp (!) 97.5 F (36.4 C) (Temporal)   Resp 16   Ht '5\' 2"'  (1.575 m)   Wt 162 lb (73.5 kg)   SpO2 95%   BMI 29.63 kg/m   Wt Readings from Last 3 Encounters:  04/25/20 162 lb (73.5 kg)  10/21/19 172 lb (78 kg)  03/24/19 176 lb (79.8 kg)    Physical Exam Vitals and nursing note reviewed.  Constitutional:      General: She is not in acute distress.    Appearance: She is well-developed. She is not diaphoretic.     Comments: Well-appearing, comfortable, cooperative  HENT:     Head: Normocephalic and atraumatic.  Eyes:     General:        Right eye: No discharge.        Left eye: No discharge.     Conjunctiva/sclera: Conjunctivae normal.     Pupils: Pupils are equal, round, and reactive to light.  Neck:     Thyroid: No thyromegaly.  Cardiovascular:     Rate and  Rhythm: Normal rate and regular rhythm.     Heart sounds: Normal heart sounds. No murmur heard.   Pulmonary:     Effort: Pulmonary effort is normal. No respiratory distress.     Breath sounds: Normal breath sounds. No wheezing or rales.  Abdominal:     General: Bowel sounds are normal. There is no distension.     Palpations: Abdomen is soft. There is no mass.     Tenderness: There is no abdominal tenderness.  Musculoskeletal:        General: No tenderness. Normal range of motion.     Cervical back: Normal range of motion and neck supple.     Comments: Upper / Lower Extremities: - Normal muscle tone, strength bilateral upper extremities 5/5, lower extremities 5/5  Lymphadenopathy:     Cervical: No cervical adenopathy.  Skin:    General: Skin is warm and dry.     Findings: No erythema or rash.  Neurological:     Mental Status: She is alert and oriented to person, place, and time.     Comments: Distal sensation intact to light touch all extremities  Psychiatric:        Behavior: Behavior normal.     Comments: Well groomed, good eye contact, normal speech and thoughts      Last mammogram 11/16/17  CLINICAL DATA:  Screening.  EXAM: DIGITAL SCREENING BILATERAL MAMMOGRAM WITH CAD  COMPARISON:  Previous exam(s).  ACR Breast Density Category b: There are scattered areas of fibroglandular density.  FINDINGS: There are no findings suspicious for malignancy. Images were processed with CAD.  IMPRESSION: No mammographic evidence of malignancy. A result letter of this screening mammogram will be mailed directly to the patient.  RECOMMENDATION: Screening mammogram in one year. (Code:SM-B-01Y)  BI-RADS CATEGORY  1: Negative.   Electronically Signed   By: Nolon Nations M.D.   On: 11/17/2017 09:14  Results for orders placed or performed in visit on 04/18/20  T4, free  Result Value Ref Range   Free T4 1.7 0.8 - 1.8 ng/dL  TSH  Result Value Ref Range   TSH 1.78  0.40 - 4.50 mIU/L  Lipid panel  Result Value Ref Range  Cholesterol 206 (H) <200 mg/dL   HDL 68 > OR = 50 mg/dL   Triglycerides 113 <150 mg/dL   LDL Cholesterol (Calc) 116 (H) mg/dL (calc)   Total CHOL/HDL Ratio 3.0 <5.0 (calc)   Non-HDL Cholesterol (Calc) 138 (H) <130 mg/dL (calc)  COMPLETE METABOLIC PANEL WITH GFR  Result Value Ref Range   Glucose, Bld 90 65 - 99 mg/dL   BUN 18 7 - 25 mg/dL   Creat 0.69 0.60 - 0.93 mg/dL   GFR, Est Non African American 87 > OR = 60 mL/min/1.33m   GFR, Est African American 101 > OR = 60 mL/min/1.723m  BUN/Creatinine Ratio NOT APPLICABLE 6 - 22 (calc)   Sodium 140 135 - 146 mmol/L   Potassium 4.1 3.5 - 5.3 mmol/L   Chloride 104 98 - 110 mmol/L   CO2 24 20 - 32 mmol/L   Calcium 9.6 8.6 - 10.4 mg/dL   Total Protein 6.7 6.1 - 8.1 g/dL   Albumin 4.2 3.6 - 5.1 g/dL   Globulin 2.5 1.9 - 3.7 g/dL (calc)   AG Ratio 1.7 1.0 - 2.5 (calc)   Total Bilirubin 1.1 0.2 - 1.2 mg/dL   Alkaline phosphatase (APISO) 91 37 - 153 U/L   AST 29 10 - 35 U/L   ALT 27 6 - 29 U/L  CBC with Differential/Platelet  Result Value Ref Range   WBC 8.1 3.8 - 10.8 Thousand/uL   RBC 5.55 (H) 3.80 - 5.10 Million/uL   Hemoglobin 16.3 (H) 11.7 - 15.5 g/dL   HCT 49.6 (H) 35 - 45 %   MCV 89.4 80.0 - 100.0 fL   MCH 29.4 27.0 - 33.0 pg   MCHC 32.9 32.0 - 36.0 g/dL   RDW 12.9 11.0 - 15.0 %   Platelets 202 140 - 400 Thousand/uL   MPV 10.6 7.5 - 12.5 fL   Neutro Abs 5,630 1,500 - 7,800 cells/uL   Lymphs Abs 1,685 850 - 3,900 cells/uL   Absolute Monocytes 632 200 - 950 cells/uL   Eosinophils Absolute 97 15 - 500 cells/uL   Basophils Absolute 57 0 - 200 cells/uL   Neutrophils Relative % 69.5 %   Total Lymphocyte 20.8 %   Monocytes Relative 7.8 %   Eosinophils Relative 1.2 %   Basophils Relative 0.7 %  Hemoglobin A1c  Result Value Ref Range   Hgb A1c MFr Bld 5.6 <5.7 % of total Hgb   Mean Plasma Glucose 114 (calc)   eAG (mmol/L) 6.3 (calc)      Assessment & Plan:    Problem List Items Addressed This Visit    Tobacco abuse   Pre-diabetes    Improved a1c 5.6  Plan:  1. Not on any therapy currently  2. Encourage improved lifestyle - low carb, low sugar diet, reduce portion size, continue improving regular exercise      Hypothyroidism    Controlled on current dose Levothyroxine Last TSH, Free T4 normal  Plan Continue Levothyroxine 10080mdaily except Sundays      Hyperlipidemia    Controlled cholesterol on statin and lifestyle Last lipid panel 04/2020  Plan: 1. Continue current meds - Atorvastatin 33m23mas refills 2. Continue ASA 325mg18m primary ASCVD risk reduction 3. Encourage improved lifestyle - low carb/cholesterol, reduce portion size, continue improving regular exercise      Essential hypertension    Well-controlled HTN - Home BP readings - normal No known complications   Plan:  1. Continue current BP regimen - HCTZ 12.5mg63m  daily, Lisinopril 56m daily, Metoprolol 583mBID 2. Encourage improved lifestyle - low sodium diet, regular exercise, smoking cessation 3. Continue monitor BP outside office, bring readings to next visit, if persistently >140/90 or new symptoms notify office sooner      Centrilobular emphysema ([place]   Stable without exacerbation, has had infrequent flares Clinical diagnosis in past, with chronic tobacco abuse, and emphysema changes on CXR - No formal Spirometry or PFTs  Plan: Continue Flovent, albuterol PRN Follow-up in future if need further Spirometry or PFTs, consider Pulm Counseling on tobacco cessation       Other Visit Diagnoses    Annual physical exam    -  Primary   Needs flu shot       Relevant Orders   Flu Vaccine QUAD High Dose(Fluad) (Completed)   Encounter for screening mammogram for malignant neoplasm of breast       Relevant Orders   MM 3D SCREEN BREAST BILATERAL   Need for shingles vaccine       Relevant Medications   [facility] Adjuvant[facility] ([facility]       Updated Health Maintenance information - high dose flu today - re print shingrix vaccine, to take to pharmacy - mammogram ordered, due every other year, she will schedule - discussed colon CA screening, she declines, has cologuard, will reconsider this option Reviewed recent lab results with patient Encouraged improvement to lifestyle with diet and exercise Maintain weight  Meds ordered this encounter  Medications  . DISCONTD: Zoster Vaccine Adjuvanted ([facility]    Sig: Inject 0.56m67mnto muscle once, then repeat in 2-6 months for 2nd dose.    Dispense:  0.5 mL    Refill:  0  . Zoster Vaccine Adjuvanted ([facility]    Sig: Inject 0.56mL108mto muscle once, then repeat in 2-6 months for 2nd dose.    Dispense:  0.5 mL    Refill:  1      Follow up plan: Return in about 6 months (around 10/23/2020) for 6 month follow-up PreDM A1c, HTN, Thryoid (no lab, check yearly).  AlexNobie Putnam [facility] 04/25/2020, 10:57 AM

## 2020-04-25 NOTE — Assessment & Plan Note (Signed)
Controlled cholesterol on statin and lifestyle Last lipid panel 04/2020  Plan: 1. Continue current meds - Atorvastatin 10mg , has refills 2. Continue ASA 325mg  for primary ASCVD risk reduction 3. Encourage improved lifestyle - low carb/cholesterol, reduce portion size, continue improving regular exercise

## 2020-04-25 NOTE — Patient Instructions (Addendum)
Thank you for coming to the office today.  Shingles vaccine, shingrix 2 doses, 2-6 months apart, can cause flu like reaction common. Safe to get around others not a live virus  For Mammogram screening for breast cancer   Call the [place] below anytime to schedule your own appointment now that order has been placed.  [facility] [address] [place], [place] [address] Phone: [phone]  ------  Flu shot today  ------------------------------------  Colon Cancer Screening: - For all adults age 46+ routine colon cancer screening is highly recommended.     - Recent guidelines from [place] recommend starting age of 67 - Early detection of colon cancer is important, because often there are no warning signs or symptoms, also if found early usually it can be cured. Late stage is hard to treat.  - If you are not interested in Colonoscopy screening (if done and normal you could be cleared for 5 to 10 years until next due), then Cologuard is an excellent alternative for screening test for Colon Cancer. It is highly sensitive for detecting DNA of colon cancer from even the earliest stages. Also, there is NO bowel prep required. - If Cologuard is NEGATIVE, then it is good for 3 years before next due - If Cologuard is POSITIVE, then it is strongly advised to get a Colonoscopy, which allows the GI doctor to locate the source of the cancer or polyp (even very early stage) and treat it by removing it. ------------------------- If you would like to proceed with Cologuard (stool DNA test) - FIRST, call your insurance company and tell them you want to check cost of Cologuard tell them CPT Code [phone] (it may be completely covered and you could get for no cost, OR max cost without any coverage is about $600). Also, keep in mind if you do NOT open the kit, and decide not to do the test, you will NOT be charged, you should contact the company if you decide not to do the  test. - If you want to proceed, you can notify us (phone message, [place], or at next visit) and we will order it for you. The test kit will be delivered to you house within about 1 week. Follow instructions to collect sample, you may call the company for any help or questions, 24/7 telephone support at [phone].    Please schedule a Follow-up Appointment to: Return in about 6 months (around 10/23/2020) for 6 month follow-up PreDM A1c, HTN, Thryoid (no lab, check yearly).  If you have any other questions or concerns, please feel free to call the office or send a message through [facility]. You may also schedule an earlier appointment if necessary.  Additionally, you may be receiving a survey about your experience at our office within a few days to 1 week by e-mail or mail. We value your feedback.  Nobie Putnam, DO [place]

## 2020-04-25 NOTE — Assessment & Plan Note (Signed)
Improved a1c 5.6  Plan:  1. Not on any therapy currently  2. Encourage improved lifestyle - low carb, low sugar diet, reduce portion size, continue improving regular exercise

## 2020-04-25 NOTE — Assessment & Plan Note (Signed)
Stable without exacerbation, has had infrequent flares Clinical diagnosis in past, with chronic tobacco abuse, and emphysema changes on CXR - No formal Spirometry or PFTs  Plan: Continue Flovent, albuterol PRN Follow-up in future if need further Spirometry or PFTs, consider Pulm Counseling on tobacco cessation

## 2020-05-23 ENCOUNTER — Other Ambulatory Visit: Payer: Self-pay | Admitting: Family Medicine

## 2020-05-23 DIAGNOSIS — I10 Essential (primary) hypertension: Secondary | ICD-10-CM

## 2020-05-23 DIAGNOSIS — B0229 Other postherpetic nervous system involvement: Secondary | ICD-10-CM

## 2020-05-23 DIAGNOSIS — E034 Atrophy of thyroid (acquired): Secondary | ICD-10-CM

## 2020-05-23 DIAGNOSIS — E782 Mixed hyperlipidemia: Secondary | ICD-10-CM

## 2020-06-11 ENCOUNTER — Ambulatory Visit: Payer: PPO | Attending: Internal Medicine

## 2020-06-11 DIAGNOSIS — Z23 Encounter for immunization: Secondary | ICD-10-CM

## 2020-06-11 NOTE — Progress Notes (Signed)
   Covid-19 Vaccination Clinic  Name:  Zaela Graley    MRN: [id number] DOB: 06-11-1946  06/11/2020  Ms. Bach was observed post Covid-19 immunization for 15 minutes without incident. She was provided with Vaccine Information Sheet and instruction to access the V-Safe system.   Ms. Shackett was instructed to call 911 with any severe reactions post vaccine: Marland Kitchen Difficulty breathing  . Swelling of face and throat  . A fast heartbeat  . A bad rash all over body  . Dizziness and weakness

## 2020-08-22 ENCOUNTER — Other Ambulatory Visit: Payer: Self-pay | Admitting: Family Medicine

## 2020-08-22 DIAGNOSIS — E782 Mixed hyperlipidemia: Secondary | ICD-10-CM

## 2020-08-22 DIAGNOSIS — I10 Essential (primary) hypertension: Secondary | ICD-10-CM

## 2020-08-22 DIAGNOSIS — B0229 Other postherpetic nervous system involvement: Secondary | ICD-10-CM

## 2020-08-22 MED ORDER — LISINOPRIL 20 MG PO TABS: ORAL_TABLET | ORAL | 1 refills | Status: DC

## 2020-08-22 MED ORDER — METOPROLOL TARTRATE 50 MG PO TABS: ORAL_TABLET | ORAL | 1 refills | Status: DC

## 2020-08-22 MED ORDER — ATORVASTATIN CALCIUM 10 MG PO TABS: ORAL_TABLET | ORAL | 3 refills | Status: DC

## 2020-08-22 MED ORDER — GABAPENTIN 100 MG PO CAPS: 200.0000 mg | ORAL_CAPSULE | Freq: Three times a day (TID) | ORAL | 0 refills | Status: DC

## 2020-08-22 NOTE — Telephone Encounter (Signed)
Medication Refill - Medication: Gabapentin, Atorvastatin, lisinopril, metoprolol  Has the patient contacted their pharmacy? No. Pt is requesting to have these sent to a new pharmacy. Please advise.  (Agent: If no, request that the patient contact the pharmacy for the refill.) (Agent: If yes, when and what did the pharmacy advise?)  Preferred Pharmacy (with phone number or street name):  [facility], [place]  [place] [address]  Phone: [phone] Fax: [phone]  Hours: Not open 24 hours     Agent: Please be advised that RX refills may take up to 3 business days. We ask that you follow-up with your pharmacy.

## 2020-09-24 ENCOUNTER — Other Ambulatory Visit: Payer: Self-pay | Admitting: Family Medicine

## 2020-09-24 DIAGNOSIS — I10 Essential (primary) hypertension: Secondary | ICD-10-CM

## 2020-09-24 MED ORDER — HYDROCHLOROTHIAZIDE 12.5 MG PO TABS: ORAL_TABLET | ORAL | 0 refills | Status: DC

## 2020-09-24 NOTE — Telephone Encounter (Signed)
Patient requesting hydrochlorothiazide (HYDRODIURIL) 12.5 MG tablet, patient contacted pharmacy was advised to reach out to PCP. Patient states this is a new pharmacy. Informed patient please allow 48 to 72 hour turn around time   [place], [place] Phone:  [phone]  Fax:  [phone]

## 2020-10-25 ENCOUNTER — Ambulatory Visit (INDEPENDENT_AMBULATORY_CARE_PROVIDER_SITE_OTHER): Payer: PPO | Admitting: Family Medicine

## 2020-10-25 ENCOUNTER — Other Ambulatory Visit: Payer: Self-pay | Admitting: Family Medicine

## 2020-10-25 ENCOUNTER — Encounter: Payer: Self-pay | Admitting: Family Medicine

## 2020-10-25 ENCOUNTER — Other Ambulatory Visit: Payer: Self-pay

## 2020-10-25 VITALS — BP 111/55 | HR 56 | Ht 62.0 in | Wt 153.0 lb

## 2020-10-25 DIAGNOSIS — I10 Essential (primary) hypertension: Secondary | ICD-10-CM

## 2020-10-25 DIAGNOSIS — J432 Centrilobular emphysema: Secondary | ICD-10-CM

## 2020-10-25 DIAGNOSIS — R7303 Prediabetes: Secondary | ICD-10-CM | POA: Diagnosis not present

## 2020-10-25 DIAGNOSIS — B0229 Other postherpetic nervous system involvement: Secondary | ICD-10-CM | POA: Diagnosis not present

## 2020-10-25 DIAGNOSIS — E034 Atrophy of thyroid (acquired): Secondary | ICD-10-CM | POA: Diagnosis not present

## 2020-10-25 DIAGNOSIS — Z0000 Encounter for general adult medical examination without abnormal findings: Secondary | ICD-10-CM

## 2020-10-25 DIAGNOSIS — E7849 Other hyperlipidemia: Secondary | ICD-10-CM

## 2020-10-25 LAB — POCT GLYCOSYLATED HEMOGLOBIN (HGB A1C): Hemoglobin A1C: 5.4 % (ref 4.0–5.6)

## 2020-10-25 MED ORDER — HYDROCHLOROTHIAZIDE 12.5 MG PO TABS: 12.5000 mg | ORAL_TABLET | Freq: Every day | ORAL | 1 refills | Status: DC

## 2020-10-25 MED ORDER — GABAPENTIN 100 MG PO CAPS: 200.0000 mg | ORAL_CAPSULE | Freq: Three times a day (TID) | ORAL | 1 refills | Status: DC

## 2020-10-25 MED ORDER — LEVOTHYROXINE SODIUM 100 MCG PO TABS: ORAL_TABLET | ORAL | 1 refills | Status: DC

## 2020-10-25 NOTE — Assessment & Plan Note (Signed)
Improved A1c to 5.4 now  Plan:  1. Not on any therapy currently  2. Encourage improved lifestyle - low carb, low sugar diet, reduce portion size, continue improving regular exercise

## 2020-10-25 NOTE — Assessment & Plan Note (Signed)
Well-controlled HTN - Home BP readings - normal No known complications   Plan:  1. Continue current BP regimen - HCTZ 12.5mg daily, Lisinopril 20mg daily, Metoprolol 50mg BID 2. Encourage improved lifestyle - low sodium diet, regular exercise, smoking cessation 3. Continue monitor BP outside office, bring readings to next visit, if persistently >140/90 or new symptoms notify office sooner 

## 2020-10-25 NOTE — Assessment & Plan Note (Signed)
Stable without exacerbation, has had infrequent flares. Doing well Rarely using albuterol Last flare 07/2020 resolved Clinical diagnosis in past, with chronic tobacco abuse, and emphysema changes on CXR - No formal Spirometry or PFTs  Plan: Continue Flovent, albuterol PRN Follow-up in future if need further Spirometry or PFTs, consider Pulm Counseling on tobacco cessation

## 2020-10-25 NOTE — Patient Instructions (Addendum)
Thank you for coming to the office today.  Refilled medications to [facility]    04/19/20 0908 10/25/20 1116  HGBA1C 5.6 5.4   Great job overall on weight loss and lifestyle.   DUE for FASTING BLOOD WORK (no food or drink after midnight before the lab appointment, only water or coffee without cream/sugar on the morning of)  SCHEDULE "Lab Only" visit in the morning at the clinic for lab draw in 6 MONTHS   - Make sure Lab Only appointment is at about 1 week before your next appointment, so that results will be available  For Lab Results, once available within 2-3 days of blood draw, you can can log in to MyChart online to view your results and a brief explanation. Also, we can discuss results at next follow-up visit.    Please schedule a Follow-up Appointment to: Return in about 6 months (around 04/27/2021) for 6 month fasting lab only then 1 week later Annual Physical.  If you have any other questions or concerns, please feel free to call the office or send a message through [facility]. You may also schedule an earlier appointment if necessary.  Additionally, you may be receiving a survey about your experience at our office within a few days to 1 week by e-mail or mail. We value your feedback.  Nobie Putnam, DO [place]

## 2020-10-25 NOTE — Progress Notes (Signed)
Subjective:    Patient ID: Sarah Adkins, female    DOB: [birth year], 75 y.o.   MRN: [id number]  Sarah Adkins is a 75 y.o. female presenting on 10/25/2020 for Prediabetes, Hypothyroidism, and Hypertension   HPI   Follow-upHypothyroidism Previous labs last time,showed normal TSH and Free T4 on current dose Synthroid 164mg daily. SheisHOLDING dose on Sundays as usual Denies worsening fatigue, weight gain, hair or nail changes, temperature changes  CHRONIC HTN: Reportsno concerns. Home readings normal. Current Meds -HCTZ 12.542mdaily, Lisinopril 2061maily, Metoprolol 50 BID Reports good compliance, took meds today. Tolerating well, w/o complaints.  HYPERLIPIDEMIA: - Reports no concerns. Last lipid panel9/2021, controlledmostly on lipid - Currently takingAtorvastatin 24m33molerating well without side effects or myalgias  Pre-Diabetes BMI >27 Prior improved A1c Due today Meds: None Currently on ACEi Lifestyle: Weight down - Diet (improving diet, reduce portions) - Exercise (walking, more active caring for grandchild) Denies hypoglycemia, polyuria, visual changes, numbness or tingling.  Centrilobular Emphysema / COPD On Flovent, Albuterol. Rarely using Albuterol. She was diagnosed with a bronchitis/respiratory infection, [facility] 07/11/21 she was treated with Amoxcillin/Prednisone. She had CXR no pneumonia. She could not get into our office for apt. Now normalized back to baseline   Health Maintenance:   Still due for routine DEXA screening for osteoporosis.She declines at this time. Had it done before, not interested in repeat.  Shingles -prior zostavax years ago, she has had shingles on scalp, causes chronic post herpetic neuralgia headaches. She is interested in new Shingrix- she will get this in 1 month. - Re order Shingrix vaccine. Printed.  Colon CA Screening: Never had colonoscopy. Currently asymptomatic. No known family history of colon CA. Due  for screening test considering Cologuard, counseling given. She has it at home still thinking about it. Has the kit but has not sent it in yet. - She is considering this but again declines for now. She is not interested to know her colon cancer screening results and will defer this test.  Due for Mammogram has order, she will schedule. Overdue  Depression screen PHQ [facility] 04/25/2020 10/21/2019 04/28/2018  Decreased Interest 0 0 0  Down, Depressed, Hopeless 0 0 0  PHQ - 2 Score 0 0 0    Social History   Tobacco Use  . Smoking status: Current Every Day Smoker    Packs/day: 0.25    Years: 50.00    Pack years: 12.50    Types: Cigarettes  . Smokeless tobacco: Current User  . Tobacco comment: Prior failed Chantix. Currently cutting down < 6 cigs daily, will use NRT  Vaping Use  . Vaping Use: Never used  Substance Use Topics  . Alcohol use: Yes    Alcohol/week: 0.0 standard drinks    Comment: occasional beer  . Drug use: No    Review of Systems Per HPI unless specifically indicated above     Objective:    BP (!) 111/55   Pulse (!) 56   Ht '5\' 2"'  (1.575 m)   Wt 153 lb (69.4 kg)   SpO2 98%   BMI 27.98 kg/m   Wt Readings from Last 3 Encounters:  10/25/20 153 lb (69.4 kg)  04/25/20 162 lb (73.5 kg)  10/21/19 172 lb (78 kg)    Physical Exam Vitals and nursing note reviewed.  Constitutional:      General: She is not in acute distress.    Appearance: She is well-developed. She is not diaphoretic.     Comments: Well-appearing, comfortable, cooperative  HENT:     Head: Normocephalic and atraumatic.  Eyes:     General:        Right eye: No discharge.        Left eye: No discharge.     Conjunctiva/sclera: Conjunctivae normal.  Neck:     Thyroid: No thyromegaly.  Cardiovascular:     Rate and Rhythm: Normal rate and regular rhythm.     Heart sounds: Normal heart sounds. No murmur heard.   Pulmonary:     Effort: Pulmonary effort is normal. No respiratory distress.      Breath sounds: Normal breath sounds. No wheezing or rales.  Musculoskeletal:        General: Normal range of motion.     Cervical back: Normal range of motion and neck supple.  Lymphadenopathy:     Cervical: No cervical adenopathy.  Skin:    General: Skin is warm and dry.     Findings: No erythema or rash.  Neurological:     Mental Status: She is alert and oriented to person, place, and time.  Psychiatric:        Behavior: Behavior normal.     Comments: Well groomed, good eye contact, normal speech and thoughts      Recent Labs    04/19/20 0908 10/25/20 1116  HGBA1C 5.6 5.4    Results for orders placed or performed in visit on 10/25/20  POCT HgB A1C  Result Value Ref Range   Hemoglobin A1C 5.4 4.0 - 5.6 %      Assessment & Plan:   Problem List Items Addressed This Visit    Pre-diabetes - Primary    Improved A1c to 5.4 now  Plan:  1. Not on any therapy currently  2. Encourage improved lifestyle - low carb, low sugar diet, reduce portion size, continue improving regular exercise      Relevant Orders   POCT HgB A1C (Completed)   Post herpetic neuralgia   Relevant Medications   gabapentin (NEURONTIN) 100 MG capsule   Hypothyroidism    Controlled on current dose Levothyroxine Last TSH, Free T4 normal - at prior visit  Plan Continue Levothyroxine 137mg daily except Sundays - add refills Next labs 6 months      Relevant Medications   levothyroxine (SYNTHROID) 100 MCG tablet   Essential hypertension    Well-controlled HTN - Home BP readings - normal No known complications   Plan:  1. Continue current BP regimen - HCTZ 12.567mdaily, Lisinopril 2057maily, Metoprolol 24m98mD 2. Encourage improved lifestyle - low sodium diet, regular exercise, smoking cessation 3. Continue monitor BP outside office, bring readings to next visit, if persistently >140/90 or new symptoms notify office sooner      Relevant Medications   hydrochlorothiazide (HYDRODIURIL) 12.5 MG  tablet   Centrilobular emphysema (HCC)    Stable without exacerbation, has had infrequent flares. Doing well Rarely using albuterol Last flare 07/2020 resolved Clinical diagnosis in past, with chronic tobacco abuse, and emphysema changes on CXR - No formal Spirometry or PFTs  Plan: Continue Flovent, albuterol PRN Follow-up in future if need further Spirometry or PFTs, consider Pulm Counseling on tobacco cessation         Meds ordered this encounter  Medications  . levothyroxine (SYNTHROID) 100 MCG tablet    Sig: TAKE 1 TABLET BY MOUTH DAILY BEFORE BREAKFAST 6 DAYS PER WEEK. DO NOT TAKE ON SUNDAYS    Dispense:  90 tablet    Refill:  1  . hydrochlorothiazide (  HYDRODIURIL) 12.5 MG tablet    Sig: Take 1 tablet (12.5 mg total) by mouth daily.    Dispense:  90 tablet    Refill:  1  . gabapentin (NEURONTIN) 100 MG capsule    Sig: Take 2 capsules (200 mg total) by mouth 3 (three) times daily.    Dispense:  540 capsule    Refill:  1      Follow up plan: Return in about 6 months (around 04/27/2021) for 6 month fasting lab only then 1 week later Annual Physical.  Future labs ordered for 05/01/21  Nobie Putnam, [facility] 10/25/2020, 11:12 AM

## 2020-10-25 NOTE — Assessment & Plan Note (Signed)
Controlled on current dose Levothyroxine Last TSH, Free T4 normal - at prior visit  Plan Continue Levothyroxine 116mcg daily except Sundays - add refills Next labs 6 months

## 2020-12-19 DIAGNOSIS — H2513 Age-related nuclear cataract, bilateral: Secondary | ICD-10-CM | POA: Diagnosis not present

## 2020-12-19 DIAGNOSIS — H43811 Vitreous degeneration, right eye: Secondary | ICD-10-CM | POA: Diagnosis not present

## 2021-01-18 DIAGNOSIS — L570 Actinic keratosis: Secondary | ICD-10-CM | POA: Diagnosis not present

## 2021-01-18 DIAGNOSIS — Z85828 Personal history of other malignant neoplasm of skin: Secondary | ICD-10-CM | POA: Diagnosis not present

## 2021-01-18 DIAGNOSIS — D2261 Melanocytic nevi of right upper limb, including shoulder: Secondary | ICD-10-CM | POA: Diagnosis not present

## 2021-01-18 DIAGNOSIS — X32XXXA Exposure to sunlight, initial encounter: Secondary | ICD-10-CM | POA: Diagnosis not present

## 2021-01-18 DIAGNOSIS — L821 Other seborrheic keratosis: Secondary | ICD-10-CM | POA: Diagnosis not present

## 2021-01-18 DIAGNOSIS — D2262 Melanocytic nevi of left upper limb, including shoulder: Secondary | ICD-10-CM | POA: Diagnosis not present

## 2021-01-18 DIAGNOSIS — D225 Melanocytic nevi of trunk: Secondary | ICD-10-CM | POA: Diagnosis not present

## 2021-02-20 ENCOUNTER — Other Ambulatory Visit: Payer: Self-pay | Admitting: Family Medicine

## 2021-02-20 DIAGNOSIS — I10 Essential (primary) hypertension: Secondary | ICD-10-CM

## 2021-04-16 ENCOUNTER — Telehealth: Payer: Self-pay | Admitting: Family Medicine

## 2021-04-16 NOTE — Telephone Encounter (Signed)
Left message for patient to call back and schedule the Medicare Annual Wellness Visit (AWV) virtually or by telephone.  Last AWV 10/20/17  Please schedule at anytime with [place].  40 minute appointment  Any questions, please call me at [phone]

## 2021-04-26 ENCOUNTER — Other Ambulatory Visit: Payer: Self-pay | Admitting: Family Medicine

## 2021-04-26 DIAGNOSIS — Z1231 Encounter for screening mammogram for malignant neoplasm of breast: Secondary | ICD-10-CM

## 2021-04-30 ENCOUNTER — Other Ambulatory Visit: Payer: Self-pay

## 2021-04-30 ENCOUNTER — Ambulatory Visit
Admission: RE | Admit: 2021-04-30 | Discharge: 2021-04-30 | Disposition: A | Discharge status: Home / Self Care | Payer: PPO | Source: Ambulatory Visit | Attending: Family Medicine | Admitting: Family Medicine

## 2021-04-30 DIAGNOSIS — Z1231 Encounter for screening mammogram for malignant neoplasm of breast: Secondary | ICD-10-CM | POA: Diagnosis not present

## 2021-04-30 DIAGNOSIS — J432 Centrilobular emphysema: Secondary | ICD-10-CM

## 2021-04-30 DIAGNOSIS — E7849 Other hyperlipidemia: Secondary | ICD-10-CM

## 2021-04-30 DIAGNOSIS — Z0000 Encounter for general adult medical examination without abnormal findings: Secondary | ICD-10-CM

## 2021-04-30 DIAGNOSIS — E034 Atrophy of thyroid (acquired): Secondary | ICD-10-CM

## 2021-04-30 DIAGNOSIS — R7303 Prediabetes: Secondary | ICD-10-CM

## 2021-04-30 DIAGNOSIS — I10 Essential (primary) hypertension: Secondary | ICD-10-CM

## 2021-05-01 ENCOUNTER — Other Ambulatory Visit: Payer: PPO

## 2021-05-01 DIAGNOSIS — R7303 Prediabetes: Secondary | ICD-10-CM | POA: Diagnosis not present

## 2021-05-01 DIAGNOSIS — Z0000 Encounter for general adult medical examination without abnormal findings: Secondary | ICD-10-CM | POA: Diagnosis not present

## 2021-05-01 DIAGNOSIS — J432 Centrilobular emphysema: Secondary | ICD-10-CM | POA: Diagnosis not present

## 2021-05-01 DIAGNOSIS — E034 Atrophy of thyroid (acquired): Secondary | ICD-10-CM | POA: Diagnosis not present

## 2021-05-01 DIAGNOSIS — E7849 Other hyperlipidemia: Secondary | ICD-10-CM | POA: Diagnosis not present

## 2021-05-01 DIAGNOSIS — I10 Essential (primary) hypertension: Secondary | ICD-10-CM | POA: Diagnosis not present

## 2021-05-02 LAB — CBC WITH DIFFERENTIAL/PLATELET
Absolute Monocytes: 626 cells/uL (ref 200–950)
Basophils Absolute: 79 cells/uL (ref 0–200)
Basophils Relative: 1.1 %
Eosinophils Absolute: 130 cells/uL (ref 15–500)
Eosinophils Relative: 1.8 %
HCT: 50.1 % — ABNORMAL HIGH (ref 35.0–45.0)
Hemoglobin: 16.1 g/dL — ABNORMAL HIGH (ref 11.7–15.5)
Lymphs Abs: 2138 cells/uL (ref 850–3900)
MCH: 29.4 pg (ref 27.0–33.0)
MCHC: 32.1 g/dL (ref 32.0–36.0)
MCV: 91.6 fL (ref 80.0–100.0)
MPV: 10.5 fL (ref 7.5–12.5)
Monocytes Relative: 8.7 %
Neutro Abs: 4226 cells/uL (ref 1500–7800)
Neutrophils Relative %: 58.7 %
Platelets: 178 10*3/uL (ref 140–400)
RBC: 5.47 10*6/uL — ABNORMAL HIGH (ref 3.80–5.10)
RDW: 12.8 % (ref 11.0–15.0)
Total Lymphocyte: 29.7 %
WBC: 7.2 10*3/uL (ref 3.8–10.8)

## 2021-05-02 LAB — LIPID PANEL
Cholesterol: 209 mg/dL — ABNORMAL HIGH (ref <200)
HDL: 67 mg/dL (ref >=50)
LDL Cholesterol (Calc): 116 mg/dL (calc) — ABNORMAL HIGH
Non-HDL Cholesterol (Calc): 142 mg/dL (calc) — ABNORMAL HIGH (ref <130)
Total CHOL/HDL Ratio: 3.1 (calc) (ref <5.0)
Triglycerides: 148 mg/dL (ref <150)

## 2021-05-02 LAB — COMPLETE METABOLIC PANEL WITH GFR
AG Ratio: 1.9 (calc) (ref 1.0–2.5)
ALT: 14 U/L (ref 6–29)
AST: 17 U/L (ref 10–35)
Albumin: 4.2 g/dL (ref 3.6–5.1)
Alkaline phosphatase (APISO): 92 U/L (ref 37–153)
BUN: 24 mg/dL (ref 7–25)
CO2: 26 mmol/L (ref 20–32)
Calcium: 9.5 mg/dL (ref 8.6–10.4)
Chloride: 103 mmol/L (ref 98–110)
Creat: 0.71 mg/dL (ref 0.60–1.00)
Globulin: 2.2 g/dL (calc) (ref 1.9–3.7)
Glucose, Bld: 96 mg/dL (ref 65–99)
Potassium: 3.8 mmol/L (ref 3.5–5.3)
Sodium: 138 mmol/L (ref 135–146)
Total Bilirubin: 1.1 mg/dL (ref 0.2–1.2)
Total Protein: 6.4 g/dL (ref 6.1–8.1)
eGFR: 90 mL/min/{1.73_m2} (ref >=60)

## 2021-05-02 LAB — HEMOGLOBIN A1C
Hgb A1c MFr Bld: 5.5 % of total Hgb (ref <5.7)
Mean Plasma Glucose: 111 mg/dL
eAG (mmol/L): 6.2 mmol/L

## 2021-05-02 LAB — T4, FREE: Free T4: 1.5 ng/dL (ref 0.8–1.8)

## 2021-05-02 LAB — TSH: TSH: 3.93 mIU/L (ref 0.40–4.50)

## 2021-05-08 ENCOUNTER — Other Ambulatory Visit: Payer: Self-pay | Admitting: Family Medicine

## 2021-05-08 ENCOUNTER — Other Ambulatory Visit: Payer: Self-pay

## 2021-05-08 ENCOUNTER — Encounter: Payer: Self-pay | Admitting: Family Medicine

## 2021-05-08 ENCOUNTER — Ambulatory Visit (INDEPENDENT_AMBULATORY_CARE_PROVIDER_SITE_OTHER): Payer: PPO | Admitting: Family Medicine

## 2021-05-08 VITALS — BP 138/55 | HR 57 | Ht 62.0 in | Wt 163.0 lb

## 2021-05-08 DIAGNOSIS — E034 Atrophy of thyroid (acquired): Secondary | ICD-10-CM

## 2021-05-08 DIAGNOSIS — E782 Mixed hyperlipidemia: Secondary | ICD-10-CM

## 2021-05-08 DIAGNOSIS — Z0000 Encounter for general adult medical examination without abnormal findings: Secondary | ICD-10-CM | POA: Diagnosis not present

## 2021-05-08 DIAGNOSIS — J3089 Other allergic rhinitis: Secondary | ICD-10-CM

## 2021-05-08 DIAGNOSIS — I10 Essential (primary) hypertension: Secondary | ICD-10-CM

## 2021-05-08 DIAGNOSIS — Z23 Encounter for immunization: Secondary | ICD-10-CM

## 2021-05-08 DIAGNOSIS — B0229 Other postherpetic nervous system involvement: Secondary | ICD-10-CM

## 2021-05-08 DIAGNOSIS — R7303 Prediabetes: Secondary | ICD-10-CM

## 2021-05-08 DIAGNOSIS — J40 Bronchitis, not specified as acute or chronic: Secondary | ICD-10-CM

## 2021-05-08 MED ORDER — ATORVASTATIN CALCIUM 10 MG PO TABS: ORAL_TABLET | ORAL | 3 refills | Status: DC

## 2021-05-08 MED ORDER — METOPROLOL TARTRATE 50 MG PO TABS: 50.0000 mg | ORAL_TABLET | Freq: Two times a day (BID) | ORAL | 3 refills | Status: DC

## 2021-05-08 MED ORDER — HYDROCHLOROTHIAZIDE 12.5 MG PO TABS: 12.5000 mg | ORAL_TABLET | Freq: Every day | ORAL | 3 refills | Status: DC

## 2021-05-08 MED ORDER — FLUTICASONE PROPIONATE HFA 110 MCG/ACT IN AERO: INHALATION_SPRAY | RESPIRATORY_TRACT | 11 refills | Status: DC

## 2021-05-08 MED ORDER — GABAPENTIN 100 MG PO CAPS: 200.0000 mg | ORAL_CAPSULE | Freq: Three times a day (TID) | ORAL | 3 refills | Status: DC

## 2021-05-08 MED ORDER — ALBUTEROL SULFATE HFA 108 (90 BASE) MCG/ACT IN AERS: 2.0000 | INHALATION_SPRAY | Freq: Four times a day (QID) | RESPIRATORY_TRACT | 3 refills | Status: DC | PRN

## 2021-05-08 MED ORDER — LEVOTHYROXINE SODIUM 100 MCG PO TABS: ORAL_TABLET | ORAL | 3 refills | Status: DC

## 2021-05-08 MED ORDER — LISINOPRIL 20 MG PO TABS: 20.0000 mg | ORAL_TABLET | Freq: Every day | ORAL | 3 refills | Status: DC

## 2021-05-08 NOTE — Patient Instructions (Addendum)
Thank you for coming to the office today.  Consider DEXA Bone Density image next year w/ mammogram, let me know if interested.  COVID19 Booster updated version when ready 1 week later  Flu Shot today  Updated Shingrix vaccine 2 doses when ready if you are interested.  DUE for FASTING BLOOD WORK (no food or drink after midnight before the lab appointment, only water or coffee without cream/sugar on the morning of)  SCHEDULE "Lab Only" visit in the morning at the clinic for lab draw in 1 YEAR  - Make sure Lab Only appointment is at about 1 week before your next appointment, so that results will be available  For Lab Results, once available within 2-3 days of blood draw, you can can log in to MyChart online to view your results and a brief explanation. Also, we can discuss results at next follow-up visit.   Please schedule a Follow-up Appointment to: Return in about 1 year (around 05/08/2022) for 1 year fasting lab only then 1 week later Annual Physical.  If you have any other questions or concerns, please feel free to call the office or send a message through [place]. You may also schedule an earlier appointment if necessary.  Additionally, you may be receiving a survey about your experience at our office within a few days to 1 week by e-mail or mail. We value your feedback.  Nobie Putnam, DO [place]

## 2021-05-08 NOTE — Progress Notes (Signed)
Subjective:    Patient ID: Sarah Adkins, female    DOB: [birth year], 75 y.o.   MRN: [id number]  Sarah Adkins is a 75 y.o. female presenting on 05/08/2021 for Annual Exam   HPI  Here for Annual Physical and Lab Review.   Follow-up Hypothyroidism Previous labs last time, showed normal TSH and Free T4 on current dose Synthroid 168mg daily. She is HOLDING dose on Sundays as usual Denies worsening fatigue, weight gain, hair or nail changes, temperature changes   CHRONIC HTN: Reports no concerns. Home readings normal. Current Meds - HCTZ 12.512mdaily, Lisinopril 2067maily, Metoprolol 50 BID   Reports good compliance, took meds today. Tolerating well, w/o complaints.    HYPERLIPIDEMIA: - Reports no concerns. 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   Centrilobular Emphysema / COPD On Flovent, Albuterol. Rarely using Albuterol.   Health Maintenance:    Consider DEXA in future if interested.   Shingles - prior zostavax years ago, she has had shingles on scalp, causes chronic post herpetic neuralgia headaches. She is interested in new Shingrix not sure when to get vaccine still.   Colon CA Screening: Never had colonoscopy. Currently asymptomatic. No known family history of colon CA. Due for screening test considering Cologuard, counseling given. She has it at home still thinking about it. Has the kit but has not sent it in yet. - She is considering this but again declines for now. She is not interested to know her colon cancer screening results and will defer this test.   Mammogram done 04/2021, negative.   Depression screen  PHQ [facility] 05/08/2021 04/25/2020 10/21/2019  Decreased Interest 0 0 0  Down, Depressed, Hopeless 0 0 0  PHQ - 2 Score 0 0 0  Altered sleeping 0 - -  Tired, decreased energy 0 - -  Change in appetite 0 - -  Feeling bad or failure about yourself  0 - -  Trouble concentrating 0 - -  Moving slowly or fidgety/restless 0 - -  Suicidal thoughts 0 - -  PHQ-9 Score 0 - -  Difficult doing work/chores Not difficult at all - -    Past Medical History:  Diagnosis Date   Hyperthyroidism    Skin cancer of face    Past Surgical History:  Procedure Laterality Date   BREAST BIOPSY Left 2002   bx /clip-neg   FOOT SURGERY Left    HAND SURGERY Bilateral    LAPAROSCOPIC TUBAL LIGATION     Social History   Socioeconomic History   Marital status: Widowed    Spouse name: Not on file   Number of children: Not on file   Years of education: Not on file   Highest education level: Not on file  Occupational History   Not on file  Tobacco Use   Smoking status: Every Day    Packs/day: 0.25    Years: 50.00    Pack years: 12.50    Types: Cigarettes   Smokeless tobacco: Current   Tobacco comments:    Prior failed Chantix. Currently cutting down < 6 cigs daily, will use NRT  Vaping Use   Vaping Use: Never used  Substance and Sexual Activity   Alcohol use:  Yes    Alcohol/week: 0.0 standard drinks    Comment: occasional beer   Drug use: No   Sexual activity: Not on file  Other Topics Concern   Not on file  Social History Narrative   Not on file   Social Determinants of Health   Financial Resource Strain: Not on file  Food Insecurity: Not on file  Transportation Needs: Not on file  Physical Activity: Not on file  Stress: Not on file  Social Connections: Not on file  Intimate Partner Violence: Not on file   Family History  Problem Relation Age of Onset   Stroke Mother    Heart attack Mother    Hypertension Mother    Congestive Heart Failure Father    Seizures Brother    Breast cancer  Cousin 5       mat cousin   Current Outpatient Medications on File Prior to Visit  Medication Sig   aspirin EC 325 MG tablet Take 325 mg by mouth daily.   Cholecalciferol (VITAMIN D3) 1000 units CAPS Take 1,000 Units by mouth daily.   Zoster Vaccine Adjuvanted [facility]) injection Inject 0.65m into muscle once, then repeat in 2-6 months for 2nd dose.   No current facility-administered medications on file prior to visit.    Review of Systems  Constitutional:  Negative for activity change, appetite change, chills, diaphoresis, fatigue and fever.  HENT:  Negative for congestion and hearing loss.   Eyes:  Negative for visual disturbance.  Respiratory:  Negative for cough, chest tightness, shortness of breath and wheezing.   Cardiovascular:  Negative for chest pain, palpitations and leg swelling.  Gastrointestinal:  Negative for abdominal pain, constipation, diarrhea, nausea and vomiting.  Genitourinary:  Negative for dysuria, frequency and hematuria.  Musculoskeletal:  Negative for arthralgias and neck pain.  Skin:  Negative for rash.  Neurological:  Negative for dizziness, weakness, light-headedness, numbness and headaches.  Hematological:  Negative for adenopathy.  Psychiatric/Behavioral:  Negative for behavioral problems, dysphoric mood and sleep disturbance.   Per HPI unless specifically indicated above      Objective:    BP (!) 138/55   Pulse (!) 57   Ht _0  (1.575 m)   Wt 163 lb (73.9 kg)   SpO2 98%   BMI 29.81 kg/m   Wt Readings from Last 3 Encounters:  05/08/21 163 lb (73.9 kg)  10/25/20 153 lb (69.4 kg)  04/25/20 162 lb (73.5 kg)    Physical Exam Vitals and nursing note reviewed.  Constitutional:      General: She is not in acute distress.    Appearance: She is well-developed. She is not diaphoretic.     Comments: Well-appearing, comfortable, cooperative  HENT:     Head: Normocephalic and atraumatic.  Eyes:     General:        Right eye: No discharge.         Left eye: No discharge.     Conjunctiva/sclera: Conjunctivae normal.     Pupils: Pupils are equal, round, and reactive to light.  Neck:     Thyroid: No thyromegaly.     Vascular: No carotid bruit.  Cardiovascular:     Rate and Rhythm: Normal rate and regular rhythm.     Pulses: Normal pulses.     Heart sounds: Normal heart sounds. No murmur heard. Pulmonary:     Effort: Pulmonary effort is normal. No respiratory distress.     Breath sounds: Normal breath sounds. No wheezing or rales.  Abdominal:  General: Bowel sounds are normal. There is no distension.     Palpations: Abdomen is soft. There is no mass.     Tenderness: There is no abdominal tenderness.  Musculoskeletal:        General: No tenderness. Normal range of motion.     Cervical back: Normal range of motion and neck supple.     Comments: Upper / Lower Extremities: - Normal muscle tone, strength bilateral upper extremities 5/5, lower extremities 5/5  Lymphadenopathy:     Cervical: No cervical adenopathy.  Skin:    General: Skin is warm and dry.     Findings: No erythema or rash.  Neurological:     Mental Status: She is alert and oriented to person, place, and time.     Comments: Distal sensation intact to light touch all extremities  Psychiatric:        Mood and Affect: Mood normal.        Behavior: Behavior normal.        Thought Content: Thought content normal.     Comments: Well groomed, good eye contact, normal speech and thoughts    I have personally reviewed the radiology report from 04/30/21 Mammogram.  CLINICAL DATA:  Screening.   EXAM: DIGITAL SCREENING BILATERAL MAMMOGRAM WITH TOMOSYNTHESIS AND CAD   TECHNIQUE: Bilateral screening digital craniocaudal and mediolateral oblique mammograms were obtained. Bilateral screening digital breast tomosynthesis was performed. The images were evaluated with computer-aided detection.   COMPARISON:  Previous exam(s).   ACR Breast Density Category b: There  are scattered areas of fibroglandular density.   FINDINGS: There are no findings suspicious for malignancy.   IMPRESSION: No mammographic evidence of malignancy. A result letter of this screening mammogram will be mailed directly to the patient.   RECOMMENDATION: Screening mammogram in one year. (Code:SM-B-01Y)   BI-RADS CATEGORY  1: Negative.     Electronically Signed   By: Fidela Salisbury M.D.   On: 05/06/2021 17:10   Results for orders placed or performed in visit on 04/30/21  T4, free  Result Value Ref Range   Free T4 1.5 0.8 - 1.8 ng/dL  TSH  Result Value Ref Range   TSH 3.93 0.40 - 4.50 mIU/L  Lipid panel  Result Value Ref Range   Cholesterol 209 (H) <200 mg/dL   HDL 67 > OR = 50 mg/dL   Triglycerides 148 <150 mg/dL   LDL Cholesterol (Calc) 116 (H) mg/dL (calc)   Total CHOL/HDL Ratio 3.1 <5.0 (calc)   Non-HDL Cholesterol (Calc) 142 (H) <130 mg/dL (calc)  COMPLETE METABOLIC PANEL WITH GFR  Result Value Ref Range   Glucose, Bld 96 65 - 99 mg/dL   BUN 24 7 - 25 mg/dL   Creat 0.71 0.60 - 1.00 mg/dL   eGFR 90 > OR = 60 mL/min/1.14m   BUN/Creatinine Ratio NOT APPLICABLE 6 - 22 (calc)   Sodium 138 135 - 146 mmol/L   Potassium 3.8 3.5 - 5.3 mmol/L   Chloride 103 98 - 110 mmol/L   CO2 26 20 - 32 mmol/L   Calcium 9.5 8.6 - 10.4 mg/dL   Total Protein 6.4 6.1 - 8.1 g/dL   Albumin 4.2 3.6 - 5.1 g/dL   Globulin 2.2 1.9 - 3.7 g/dL (calc)   AG Ratio 1.9 1.0 - 2.5 (calc)   Total Bilirubin 1.1 0.2 - 1.2 mg/dL   Alkaline phosphatase (APISO) 92 37 - 153 U/L   AST 17 10 - 35 U/L   ALT 14 6 - 29  U/L  CBC with Differential/Platelet  Result Value Ref Range   WBC 7.2 3.8 - 10.8 Thousand/uL   RBC 5.47 (H) 3.80 - 5.10 Million/uL   Hemoglobin 16.1 (H) 11.7 - 15.5 g/dL   HCT 50.1 (H) 35.0 - 45.0 %   MCV 91.6 80.0 - 100.0 fL   MCH 29.4 27.0 - 33.0 pg   MCHC 32.1 32.0 - 36.0 g/dL   RDW 12.8 11.0 - 15.0 %   Platelets 178 140 - 400 Thousand/uL   MPV 10.5 7.5 - 12.5 fL    Neutro Abs 4,226 1,500 - 7,800 cells/uL   Lymphs Abs 2,138 850 - 3,900 cells/uL   Absolute Monocytes 626 200 - 950 cells/uL   Eosinophils Absolute 130 15 - 500 cells/uL   Basophils Absolute 79 0 - 200 cells/uL   Neutrophils Relative % 58.7 %   Total Lymphocyte 29.7 %   Monocytes Relative 8.7 %   Eosinophils Relative 1.8 %   Basophils Relative 1.1 %  Hemoglobin A1c  Result Value Ref Range   Hgb A1c MFr Bld 5.5 <5.7 % of total Hgb   Mean Plasma Glucose 111 mg/dL   eAG (mmol/L) 6.2 mmol/L      Assessment & Plan:   Problem List Items Addressed This Visit     Hyperlipidemia   Other Visit Diagnoses     Annual physical exam    -  Primary   Needs flu shot       Relevant Orders   Flu Vaccine QUAD High Dose(Fluad) (Completed)      Updated Health Maintenance information Flu Shot today Future COVID Booster Declines Colon CA SCreen Completed Mammogram 04/2021 Reviewed recent lab results with patient Encouraged improvement to lifestyle with diet and exercise Goal of weight loss   No orders of the defined types were placed in this encounter.     Follow up plan: Return in about 1 year (around 05/08/2022) for 1 year fasting lab only then 1 week later Annual Physical.  Future labs ordered for 05/05/22 add TSH T4  Nobie Putnam, DO [facility] 05/08/2021, 11:00 AM

## 2021-09-10 DIAGNOSIS — H2513 Age-related nuclear cataract, bilateral: Secondary | ICD-10-CM | POA: Diagnosis not present

## 2021-09-10 DIAGNOSIS — H43811 Vitreous degeneration, right eye: Secondary | ICD-10-CM | POA: Diagnosis not present

## 2021-09-12 ENCOUNTER — Telehealth: Payer: Self-pay

## 2021-09-12 NOTE — Telephone Encounter (Signed)
I attempted to contact the patient to schedule her a Medicare AWV, No answer. LMOM.

## 2021-10-30 ENCOUNTER — Telehealth: Payer: Self-pay | Admitting: Family Medicine

## 2021-10-30 NOTE — Telephone Encounter (Signed)
Left message for patient to call back and schedule the Medicare Annual Wellness Visit (AWV) virtually or by telephone. ? ?Last AWV 10/20/17 ? ?Please schedule at anytime with [facility]. ? ?45 minute in office appointment, or 30 minute telephone visit ? ?Any questions, please call me at [phone]  ?

## 2021-12-16 DIAGNOSIS — H25013 Cortical age-related cataract, bilateral: Secondary | ICD-10-CM | POA: Diagnosis not present

## 2021-12-16 DIAGNOSIS — H524 Presbyopia: Secondary | ICD-10-CM | POA: Diagnosis not present

## 2021-12-16 DIAGNOSIS — H2513 Age-related nuclear cataract, bilateral: Secondary | ICD-10-CM | POA: Diagnosis not present

## 2021-12-16 DIAGNOSIS — G43B0 Ophthalmoplegic migraine, not intractable: Secondary | ICD-10-CM | POA: Diagnosis not present

## 2021-12-16 DIAGNOSIS — H35033 Hypertensive retinopathy, bilateral: Secondary | ICD-10-CM | POA: Diagnosis not present

## 2022-01-20 DIAGNOSIS — D2272 Melanocytic nevi of left lower limb, including hip: Secondary | ICD-10-CM | POA: Diagnosis not present

## 2022-01-20 DIAGNOSIS — D225 Melanocytic nevi of trunk: Secondary | ICD-10-CM | POA: Diagnosis not present

## 2022-01-20 DIAGNOSIS — D2262 Melanocytic nevi of left upper limb, including shoulder: Secondary | ICD-10-CM | POA: Diagnosis not present

## 2022-01-20 DIAGNOSIS — Z85828 Personal history of other malignant neoplasm of skin: Secondary | ICD-10-CM | POA: Diagnosis not present

## 2022-01-20 DIAGNOSIS — L821 Other seborrheic keratosis: Secondary | ICD-10-CM | POA: Diagnosis not present

## 2022-02-25 DIAGNOSIS — H01002 Unspecified blepharitis right lower eyelid: Secondary | ICD-10-CM | POA: Diagnosis not present

## 2022-02-25 DIAGNOSIS — H04123 Dry eye syndrome of bilateral lacrimal glands: Secondary | ICD-10-CM | POA: Diagnosis not present

## 2022-02-25 DIAGNOSIS — H2513 Age-related nuclear cataract, bilateral: Secondary | ICD-10-CM | POA: Diagnosis not present

## 2022-02-25 DIAGNOSIS — H01005 Unspecified blepharitis left lower eyelid: Secondary | ICD-10-CM | POA: Diagnosis not present

## 2022-02-25 DIAGNOSIS — H43811 Vitreous degeneration, right eye: Secondary | ICD-10-CM | POA: Diagnosis not present

## 2022-04-29 DIAGNOSIS — H01005 Unspecified blepharitis left lower eyelid: Secondary | ICD-10-CM | POA: Diagnosis not present

## 2022-04-29 DIAGNOSIS — H01002 Unspecified blepharitis right lower eyelid: Secondary | ICD-10-CM | POA: Diagnosis not present

## 2022-04-29 DIAGNOSIS — H04123 Dry eye syndrome of bilateral lacrimal glands: Secondary | ICD-10-CM | POA: Diagnosis not present

## 2022-04-29 DIAGNOSIS — H2513 Age-related nuclear cataract, bilateral: Secondary | ICD-10-CM | POA: Diagnosis not present

## 2022-05-05 ENCOUNTER — Other Ambulatory Visit: Payer: PPO

## 2022-05-05 ENCOUNTER — Other Ambulatory Visit: Payer: Self-pay

## 2022-05-05 DIAGNOSIS — Z0000 Encounter for general adult medical examination without abnormal findings: Secondary | ICD-10-CM

## 2022-05-05 DIAGNOSIS — E782 Mixed hyperlipidemia: Secondary | ICD-10-CM | POA: Diagnosis not present

## 2022-05-05 DIAGNOSIS — R7303 Prediabetes: Secondary | ICD-10-CM | POA: Diagnosis not present

## 2022-05-05 DIAGNOSIS — E034 Atrophy of thyroid (acquired): Secondary | ICD-10-CM

## 2022-05-05 DIAGNOSIS — I10 Essential (primary) hypertension: Secondary | ICD-10-CM

## 2022-05-06 LAB — COMPLETE METABOLIC PANEL WITH GFR
AG Ratio: 1.9 (calc) (ref 1.0–2.5)
ALT: 14 U/L (ref 6–29)
AST: 17 U/L (ref 10–35)
Albumin: 4.1 g/dL (ref 3.6–5.1)
Alkaline phosphatase (APISO): 86 U/L (ref 37–153)
BUN: 17 mg/dL (ref 7–25)
CO2: 24 mmol/L (ref 20–32)
Calcium: 9.6 mg/dL (ref 8.6–10.4)
Chloride: 103 mmol/L (ref 98–110)
Creat: 0.8 mg/dL (ref 0.60–1.00)
Globulin: 2.2 g/dL (calc) (ref 1.9–3.7)
Glucose, Bld: 95 mg/dL (ref 65–99)
Potassium: 4 mmol/L (ref 3.5–5.3)
Sodium: 137 mmol/L (ref 135–146)
Total Bilirubin: 0.9 mg/dL (ref 0.2–1.2)
Total Protein: 6.3 g/dL (ref 6.1–8.1)
eGFR: 77 mL/min/{1.73_m2} (ref >=60)

## 2022-05-06 LAB — CBC WITH DIFFERENTIAL/PLATELET
Absolute Monocytes: 614 cells/uL (ref 200–950)
Basophils Absolute: 74 cells/uL (ref 0–200)
Basophils Relative: 1 %
Eosinophils Absolute: 148 cells/uL (ref 15–500)
Eosinophils Relative: 2 %
HCT: 47.4 % — ABNORMAL HIGH (ref 35.0–45.0)
Hemoglobin: 16 g/dL — ABNORMAL HIGH (ref 11.7–15.5)
Lymphs Abs: 1791 cells/uL (ref 850–3900)
MCH: 30.2 pg (ref 27.0–33.0)
MCHC: 33.8 g/dL (ref 32.0–36.0)
MCV: 89.6 fL (ref 80.0–100.0)
MPV: 10.6 fL (ref 7.5–12.5)
Monocytes Relative: 8.3 %
Neutro Abs: 4773 cells/uL (ref 1500–7800)
Neutrophils Relative %: 64.5 %
Platelets: 211 10*3/uL (ref 140–400)
RBC: 5.29 10*6/uL — ABNORMAL HIGH (ref 3.80–5.10)
RDW: 13.1 % (ref 11.0–15.0)
Total Lymphocyte: 24.2 %
WBC: 7.4 10*3/uL (ref 3.8–10.8)

## 2022-05-06 LAB — T4, FREE: Free T4: 1.6 ng/dL (ref 0.8–1.8)

## 2022-05-06 LAB — TSH: TSH: 2.67 mIU/L (ref 0.40–4.50)

## 2022-05-06 LAB — LIPID PANEL
Cholesterol: 186 mg/dL (ref <200)
HDL: 62 mg/dL (ref >=50)
LDL Cholesterol (Calc): 99 mg/dL (calc)
Non-HDL Cholesterol (Calc): 124 mg/dL (calc) (ref <130)
Total CHOL/HDL Ratio: 3 (calc) (ref <5.0)
Triglycerides: 155 mg/dL — ABNORMAL HIGH (ref <150)

## 2022-05-06 LAB — HEMOGLOBIN A1C
Hgb A1c MFr Bld: 5.6 % of total Hgb (ref <5.7)
Mean Plasma Glucose: 114 mg/dL
eAG (mmol/L): 6.3 mmol/L

## 2022-05-12 ENCOUNTER — Encounter: Payer: Self-pay | Admitting: Family Medicine

## 2022-05-12 ENCOUNTER — Other Ambulatory Visit: Payer: Self-pay | Admitting: Family Medicine

## 2022-05-12 ENCOUNTER — Ambulatory Visit (INDEPENDENT_AMBULATORY_CARE_PROVIDER_SITE_OTHER): Payer: PPO | Admitting: Family Medicine

## 2022-05-12 VITALS — BP 129/62 | HR 59 | Ht 62.0 in | Wt 168.6 lb

## 2022-05-12 DIAGNOSIS — B0229 Other postherpetic nervous system involvement: Secondary | ICD-10-CM

## 2022-05-12 DIAGNOSIS — J432 Centrilobular emphysema: Secondary | ICD-10-CM

## 2022-05-12 DIAGNOSIS — J40 Bronchitis, not specified as acute or chronic: Secondary | ICD-10-CM

## 2022-05-12 DIAGNOSIS — Z0000 Encounter for general adult medical examination without abnormal findings: Secondary | ICD-10-CM

## 2022-05-12 DIAGNOSIS — J3089 Other allergic rhinitis: Secondary | ICD-10-CM | POA: Diagnosis not present

## 2022-05-12 DIAGNOSIS — F17210 Nicotine dependence, cigarettes, uncomplicated: Secondary | ICD-10-CM

## 2022-05-12 DIAGNOSIS — I10 Essential (primary) hypertension: Secondary | ICD-10-CM

## 2022-05-12 DIAGNOSIS — E034 Atrophy of thyroid (acquired): Secondary | ICD-10-CM | POA: Diagnosis not present

## 2022-05-12 DIAGNOSIS — E782 Mixed hyperlipidemia: Secondary | ICD-10-CM | POA: Diagnosis not present

## 2022-05-12 DIAGNOSIS — R7303 Prediabetes: Secondary | ICD-10-CM

## 2022-05-12 DIAGNOSIS — Z23 Encounter for immunization: Secondary | ICD-10-CM

## 2022-05-12 MED ORDER — GABAPENTIN 100 MG PO CAPS: 200.0000 mg | ORAL_CAPSULE | Freq: Three times a day (TID) | ORAL | 3 refills | Status: DC

## 2022-05-12 MED ORDER — HYDROCHLOROTHIAZIDE 12.5 MG PO TABS: 12.5000 mg | ORAL_TABLET | Freq: Every day | ORAL | 3 refills | Status: DC

## 2022-05-12 MED ORDER — LISINOPRIL 20 MG PO TABS: 20.0000 mg | ORAL_TABLET | Freq: Every day | ORAL | 3 refills | Status: DC

## 2022-05-12 MED ORDER — LEVOTHYROXINE SODIUM 100 MCG PO TABS: ORAL_TABLET | ORAL | 3 refills | Status: DC

## 2022-05-12 MED ORDER — METOPROLOL TARTRATE 50 MG PO TABS: 50.0000 mg | ORAL_TABLET | Freq: Two times a day (BID) | ORAL | 3 refills | Status: DC

## 2022-05-12 MED ORDER — ATORVASTATIN CALCIUM 10 MG PO TABS: ORAL_TABLET | ORAL | 3 refills | Status: DC

## 2022-05-12 NOTE — Assessment & Plan Note (Signed)
Well-controlled HTN - Home BP readings - normal No known complications   Plan:  1. Continue current BP regimen - HCTZ 12.'5mg'$  daily, Lisinopril '20mg'$  daily, Metoprolol '50mg'$  BID 2. Encourage improved lifestyle - low sodium diet, regular exercise, smoking cessation 3. Continue monitor BP outside office, bring readings to next visit, if persistently >140/90 or new symptoms notify office sooner

## 2022-05-12 NOTE — Assessment & Plan Note (Signed)
Re order Gabapentin

## 2022-05-12 NOTE — Assessment & Plan Note (Signed)
Stable without exacerbation, has had infrequent flares Rarely using albuterol Clinical diagnosis in past, with chronic tobacco abuse, and emphysema changes on CXR - No formal Spirometry or PFTs  Plan: Continue Flovent, albuterol PRN - has refills Follow-up in future if need further Spirometry or PFTs, consider Pulm Counseling on tobacco cessation

## 2022-05-12 NOTE — Assessment & Plan Note (Signed)
A1c 5.6 stable  Plan:  1. Not on any therapy currently  2. Encourage improved lifestyle - low carb, low sugar diet, reduce portion size, continue improving regular exercise

## 2022-05-12 NOTE — Assessment & Plan Note (Signed)
Controlled on current therapy Last labs normal thyroid Continue levothyroxine 150mg 6 days a week, skip sunday

## 2022-05-12 NOTE — Progress Notes (Signed)
Subjective:    Patient ID: Sarah Adkins, female    DOB: 02/29/1946, 76 y.o.   MRN: [id number]  Sarah Adkins is a 76 y.o. female presenting on 05/12/2022 for Annual Exam   HPI  Here for Annual Physical and Lab Review.     CHRONIC HTN: Reports no concerns. Home readings normal. Current Meds - HCTZ 12.23m daily, Lisinopril 224mdaily, Metoprolol 50 BID   Reports good compliance, took meds today. Tolerating well, w/o complaints.    HYPERLIPIDEMIA: - Reports no concerns. Last lipid panel 04/2022, controlled mostly on lipid - Currently taking Atorvastatin 1073mtolerating well without side effects or myalgias   Pre-Diabetes BMI >30 A1c 5. Meds: None Currently on ACEi Lifestyle: Weight down - Diet (improving diet, reduce portions) - Exercise (walking, more active caring for grandchild) Denies hypoglycemia, polyuria, visual changes, numbness or tingling.   Centrilobular Emphysema / COPD On Flovent, Albuterol. Rarely using Albuterol.  Hypothyroidism Lab panel is normal. With TSH Controlled Continues on Levothyroxine 100m48maily except skip _0 /09/2021    9:49 AM 05/08/2021   10:44 AM 04/25/2020    10:39 AM  Depression screen PHQ 2/9  Decreased Interest 0 0 0  Down, Depressed, Hopeless 0 0 0  PHQ - 2 Score 0 0 0  Altered sleeping 0 0   Tired, decreased energy 0 0   Change in appetite 0 0   Feeling bad or failure about yourself  0 0   Trouble concentrating 0 0   Moving slowly or fidgety/restless 0 0   Suicidal thoughts 0 0   PHQ-9 Score 0 0   Difficult doing work/chores Not difficult at all Not difficult at all     Past Medical History:  Diagnosis Date   Hyperthyroidism    Skin cancer of face    Past Surgical History:  Procedure Laterality Date   BREAST BIOPSY Left 2002   bx /clip-neg   FOOT SURGERY Left    HAND SURGERY Bilateral    LAPAROSCOPIC TUBAL LIGATION     Social History   Socioeconomic History   Marital status: Widowed    Spouse name: Not on file   Number of children: Not on file   Years of education: Not on file   Highest education level: Not on file  Occupational History   Not on file  Tobacco Use   Smoking status: Every Day    Packs/day: 0.25    Years: 50.00    Total pack years: 12.50    Types: Cigarettes   Smokeless tobacco: Current   Tobacco comments:    Prior failed Chantix. Currently cutting down < 6 cigs daily, will use  NRT  Vaping Use   Vaping Use: Never used  Substance and Sexual Activity   Alcohol use: Yes    Alcohol/week: 0.0 standard drinks of alcohol    Comment: occasional beer   Drug use: No   Sexual activity: Not on file  Other Topics Concern   Not on file  Social History Narrative   Not on file   Social Determinants of Health   Financial Resource Strain: Low Risk  (10/20/2017)   Overall Financial Resource Strain (CARDIA)    Difficulty of Paying Living Expenses: Not hard at all  Food Insecurity: No Food Insecurity (10/20/2017)   Hunger Vital Sign    Worried About Running Out of Food in the Last Year: Never true    Ran Out of Food in the Last Year: Never true  Transportation Needs: No Transportation Needs (10/20/2017)    PRAPARE - Hydrologist (Medical): No    Lack of Transportation (Non-Medical): No  Physical Activity: Inactive (10/20/2017)   Exercise Vital Sign    Days of Exercise per Week: 0 days    Minutes of Exercise per Session: 0 min  Stress: No Stress Concern Present (10/20/2017)   [place]    Feeling of Stress : Not at all  Social Connections: Moderately Isolated (10/20/2017)   Social Connection and Isolation Panel [NHANES]    Frequency of Communication with Friends and Family: More than three times a week    Frequency of Social Gatherings with Friends and Family: More than three times a week    Attends Religious Services: Never    Marine scientist or Organizations: No    Attends Archivist Meetings: Never    Marital Status: Widowed  Intimate Partner Violence: Not At Risk (10/20/2017)   Humiliation, Afraid, Rape, and Kick questionnaire    Fear of Current or Ex-Partner: No    Emotionally Abused: No    Physically Abused: No    Sexually Abused: No   Family History  Problem Relation Age of Onset   Stroke Mother    Heart attack Mother    Hypertension Mother    Congestive Heart Failure Father    Seizures Brother    Breast cancer Cousin 53       mat cousin   Current Outpatient Medications on File Prior to Visit  Medication Sig   albuterol (PROAIR HFA) 108 (90 Base) MCG/ACT inhaler Inhale 2 puffs into the lungs every 6 (six) hours as needed for wheezing or shortness of breath.   aspirin EC 325 MG tablet Take 325 mg by mouth daily.   Cholecalciferol (VITAMIN D3) 1000 units CAPS Take 1,000 Units by mouth daily.   fluticasone (FLOVENT HFA) 110 MCG/ACT inhaler INHALE 1 PUFF INTO THE LUNGS TWICE DAILY AS NEEDED   No current facility-administered medications on file prior to visit.    Review of Systems  Constitutional:  Negative for activity change, appetite change, chills, diaphoresis,  fatigue and fever.  HENT:  Negative for congestion and hearing loss.   Eyes:  Negative for visual disturbance.  Respiratory:  Negative for cough, chest tightness, shortness of breath and wheezing.   Cardiovascular:  Negative for chest pain, palpitations and leg swelling.  Gastrointestinal:  Negative for abdominal pain, constipation, diarrhea, nausea and vomiting.  Genitourinary:  Negative for dysuria, frequency and hematuria.  Musculoskeletal:  Negative for arthralgias and neck pain.  Skin:  Negative for rash.  Neurological:  Negative  for dizziness, weakness, light-headedness, numbness and headaches.  Hematological:  Negative for adenopathy.  Psychiatric/Behavioral:  Negative for behavioral problems, dysphoric mood and sleep disturbance.    Per HPI unless specifically indicated above      Objective:    BP 129/62   Pulse (!) 59   Ht _0  (1.575 m)   Wt 168 lb 9.6 oz (76.5 kg)   SpO2 96%   BMI 30.84 kg/m   Wt Readings from Last 3 Encounters:  05/12/22 168 lb 9.6 oz (76.5 kg)  05/08/21 163 lb (73.9 kg)  10/25/20 153 lb (69.4 kg)    Physical Exam Vitals and nursing note reviewed.  Constitutional:      General: She is not in acute distress.    Appearance: She is well-developed. She is not diaphoretic.     Comments: Well-appearing, comfortable, cooperative  HENT:     Head: Normocephalic and atraumatic.  Eyes:     General:        Right eye: No discharge.        Left eye: No discharge.     Conjunctiva/sclera: Conjunctivae normal.     Pupils: Pupils are equal, round, and reactive to light.  Neck:     Thyroid: No thyromegaly.  Cardiovascular:     Rate and Rhythm: Normal rate and regular rhythm.     Pulses: Normal pulses.     Heart sounds: Normal heart sounds. No murmur heard. Pulmonary:     Effort: Pulmonary effort is normal. No respiratory distress.     Breath sounds: Normal breath sounds. No wheezing or rales.  Abdominal:     General: Bowel sounds are normal. There is  no distension.     Palpations: Abdomen is soft. There is no mass.     Tenderness: There is no abdominal tenderness.  Musculoskeletal:        General: No tenderness. Normal range of motion.     Cervical back: Normal range of motion and neck supple.     Comments: Upper / Lower Extremities: - Normal muscle tone, strength bilateral upper extremities 5/5, lower extremities 5/5  Lymphadenopathy:     Cervical: No cervical adenopathy.  Skin:    General: Skin is warm and dry.     Findings: No erythema or rash.  Neurological:     Mental Status: She is alert and oriented to person, place, and time.     Comments: Distal sensation intact to light touch all extremities  Psychiatric:        Mood and Affect: Mood normal.        Behavior: Behavior normal.        Thought Content: Thought content normal.     Comments: Well groomed, good eye contact, normal speech and thoughts    Results for orders placed or performed in visit on 05/05/22  T4, free  Result Value Ref Range   Free T4 1.6 0.8 - 1.8 ng/dL  TSH  Result Value Ref Range   TSH 2.67 0.40 - 4.50 mIU/L  Hemoglobin A1c  Result Value Ref Range   Hgb A1c MFr Bld 5.6 <5.7 % of total Hgb   Mean Plasma Glucose 114 mg/dL   eAG (mmol/L) 6.3 mmol/L  Lipid panel  Result Value Ref Range   Cholesterol 186 <200 mg/dL   HDL 62 > OR = 50 mg/dL   Triglycerides 155 (H) <150 mg/dL   LDL Cholesterol (Calc) 99 mg/dL (calc)   Total CHOL/HDL Ratio 3.0 <5.0 (calc)   Non-HDL Cholesterol (  Calc) 124 <130 mg/dL (calc)  CBC with Differential/Platelet  Result Value Ref Range   WBC 7.4 3.8 - 10.8 Thousand/uL   RBC 5.29 (H) 3.80 - 5.10 Million/uL   Hemoglobin 16.0 (H) 11.7 - 15.5 g/dL   HCT 47.4 (H) 35.0 - 45.0 %   MCV 89.6 80.0 - 100.0 fL   MCH 30.2 27.0 - 33.0 pg   MCHC 33.8 32.0 - 36.0 g/dL   RDW 13.1 11.0 - 15.0 %   Platelets 211 140 - 400 Thousand/uL   MPV 10.6 7.5 - 12.5 fL   Neutro Abs 4,773 1,500 - 7,800 cells/uL   Lymphs Abs 1,791 850 - 3,900  cells/uL   Absolute Monocytes 614 200 - 950 cells/uL   Eosinophils Absolute 148 15 - 500 cells/uL   Basophils Absolute 74 0 - 200 cells/uL   Neutrophils Relative % 64.5 %   Total Lymphocyte 24.2 %   Monocytes Relative 8.3 %   Eosinophils Relative 2.0 %   Basophils Relative 1.0 %  COMPLETE METABOLIC PANEL WITH GFR  Result Value Ref Range   Glucose, Bld 95 65 - 99 mg/dL   BUN 17 7 - 25 mg/dL   Creat 0.80 0.60 - 1.00 mg/dL   eGFR 77 > OR = 60 mL/min/1.69m   BUN/Creatinine Ratio SEE NOTE: 6 - 22 (calc)   Sodium 137 135 - 146 mmol/L   Potassium 4.0 3.5 - 5.3 mmol/L   Chloride 103 98 - 110 mmol/L   CO2 24 20 - 32 mmol/L   Calcium 9.6 8.6 - 10.4 mg/dL   Total Protein 6.3 6.1 - 8.1 g/dL   Albumin 4.1 3.6 - 5.1 g/dL   Globulin 2.2 1.9 - 3.7 g/dL (calc)   AG Ratio 1.9 1.0 - 2.5 (calc)   Total Bilirubin 0.9 0.2 - 1.2 mg/dL   Alkaline phosphatase (APISO) 86 37 - 153 U/L   AST 17 10 - 35 U/L   ALT 14 6 - 29 U/L      Assessment & Plan:   Problem List Items Addressed This Visit     Centrilobular emphysema (HCC)    Stable without exacerbation, has had infrequent flares Rarely using albuterol Clinical diagnosis in past, with chronic tobacco abuse, and emphysema changes on CXR - No formal Spirometry or PFTs  Plan: Continue Flovent, albuterol PRN - has refills Follow-up in future if need further Spirometry or PFTs, consider Pulm Counseling on tobacco cessation      Essential hypertension    Well-controlled HTN - Home BP readings - normal No known complications   Plan:  1. Continue current BP regimen - HCTZ 12.563mdaily, Lisinopril 2037maily, Metoprolol 74m35mD 2. Encourage improved lifestyle - low sodium diet, regular exercise, smoking cessation 3. Continue monitor BP outside office, bring readings to next visit, if persistently >140/90 or new symptoms notify office sooner      Relevant Medications   hydrochlorothiazide (HYDRODIURIL) 12.5 MG tablet   atorvastatin (LIPITOR)  10 MG tablet   lisinopril (ZESTRIL) 20 MG tablet   metoprolol tartrate (LOPRESSOR) 50 MG tablet   Hyperlipidemia    Controlled cholesterol on statin and lifestyle  Plan: 1. Continue current meds - Atorvastatin 10mg62ms refills 2. Continue ASA 325mg 38mprimary ASCVD risk reduction 3. Encourage improved lifestyle - low carb/cholesterol, reduce portion size, continue improving regular exercise      Relevant Medications   hydrochlorothiazide (HYDRODIURIL) 12.5 MG tablet   atorvastatin (LIPITOR) 10 MG tablet   lisinopril (ZESTRIL) 20 MG  tablet   metoprolol tartrate (LOPRESSOR) 50 MG tablet   Hypothyroidism    Controlled on current therapy Last labs normal thyroid Continue levothyroxine 141mg 6 days a week, skip sunday      Relevant Medications   levothyroxine (SYNTHROID) 100 MCG tablet   metoprolol tartrate (LOPRESSOR) 50 MG tablet   Post herpetic neuralgia    Re order Gabapentin      Relevant Medications   gabapentin (NEURONTIN) 100 MG capsule   Pre-diabetes    A1c 5.6 stable  Plan:  1. Not on any therapy currently  2. Encourage improved lifestyle - low carb, low sugar diet, reduce portion size, continue improving regular exercise      Other Visit Diagnoses     Annual physical exam    -  Primary   Needs flu shot       Relevant Orders   Flu Vaccine QUAD High Dose(Fluad)   Seasonal allergic rhinitis due to other allergic trigger       Bronchitis       History of bronchospasm with bronchitis and concern for possible COPD, requesting refill Albuterol PRN       Updated Health Maintenance information Reviewed recent lab results with patient Encouraged improvement to lifestyle with diet and exercise Goal of weight loss  All labs reviewed, overall excellent results.  Hemoglobin slightly elevated still, thicker blood. Can be with smoking history.  Kidney function and liver normal  Recent Labs    05/05/22 0806  HGBA1C 5.6   Cholesterol and Thyroid  controlled.  Refilled all medications.  Flu Shot today Then recommend COVID Booster within 1 week at Pharmacy Future RSV Vaccine at the pharmacy 1-2 weeks later.  Shingles vaccine in the Spring  Meds ordered this encounter  Medications   levothyroxine (SYNTHROID) 100 MCG tablet    Sig: TAKE 1 TABLET BY MOUTH DAILY BEFORE BREAKFAST 6 DAYS PER WEEK. DO NOT TAKE ON SUNDAYS    Dispense:  90 tablet    Refill:  3   hydrochlorothiazide (HYDRODIURIL) 12.5 MG tablet    Sig: Take 1 tablet (12.5 mg total) by mouth daily.    Dispense:  90 tablet    Refill:  3   gabapentin (NEURONTIN) 100 MG capsule    Sig: Take 2 capsules (200 mg total) by mouth 3 (three) times daily.    Dispense:  540 capsule    Refill:  3   atorvastatin (LIPITOR) 10 MG tablet    Sig: TAKE 1 TABLET(10 MG) BY MOUTH AT BEDTIME    Dispense:  90 tablet    Refill:  3   lisinopril (ZESTRIL) 20 MG tablet    Sig: Take 1 tablet (20 mg total) by mouth daily.    Dispense:  90 tablet    Refill:  3   metoprolol tartrate (LOPRESSOR) 50 MG tablet    Sig: Take 1 tablet (50 mg total) by mouth 2 (two) times daily.    Dispense:  180 tablet    Refill:  3      Follow up plan: Return in about 1 year (around 05/13/2023) for 1 year fasting lab only then 1 week later Annual Physical.  Future labs ordered   ANobie Putnam [employer] 05/12/2022, 10:03 AM

## 2022-05-12 NOTE — Patient Instructions (Addendum)
Thank you for coming to the office today.  All labs reviewed, overall excellent results.  Hemoglobin slightly elevated still, thicker blood. Can be with smoking history.  Kidney function and liver normal  Recent Labs    05/05/22 0806  HGBA1C 5.6   Cholesterol and Thyroid controlled.  Refilled all medications.  Flu Shot today Then recommend COVID Booster within 1 week at Pharmacy Future RSV Vaccine at the pharmacy 1-2 weeks later.  Shingles vaccine in the Spring  DUE for FASTING BLOOD WORK (no food or drink after midnight before the lab appointment, only water or coffee without cream/sugar on the morning of)  SCHEDULE "Lab Only" visit in the morning at the clinic for lab draw in  1 YEAR  - Make sure Lab Only appointment is at about 1 week before your next appointment, so that results will be available  For Lab Results, once available within 2-3 days of blood draw, you can can log in to MyChart online to view your results and a brief explanation. Also, we can discuss results at next follow-up visit.     Please schedule a Follow-up Appointment to: Return in about 1 year (around 05/13/2023) for 1 year fasting lab only then 1 week later Annual Physical.  If you have any other questions or concerns, please feel free to call the office or send a message through [place]. You may also schedule an earlier appointment if necessary.  Additionally, you may be receiving a survey about your experience at our office within a few days to 1 week by e-mail or mail. We value your feedback.  Nobie Putnam, DO [place]

## 2022-05-12 NOTE — Assessment & Plan Note (Signed)
Controlled cholesterol on statin and lifestyle  Plan: 1. Continue current meds - Atorvastatin '10mg'$ , has refills 2. Continue ASA '325mg'$  for primary ASCVD risk reduction 3. Encourage improved lifestyle - low carb/cholesterol, reduce portion size, continue improving regular exercise

## 2022-05-14 ENCOUNTER — Other Ambulatory Visit: Payer: Self-pay | Admitting: Family Medicine

## 2022-05-14 DIAGNOSIS — J40 Bronchitis, not specified as acute or chronic: Secondary | ICD-10-CM

## 2022-05-14 DIAGNOSIS — J3089 Other allergic rhinitis: Secondary | ICD-10-CM

## 2022-05-14 MED ORDER — FLUTICASONE PROPIONATE HFA 110 MCG/ACT IN AERO: INHALATION_SPRAY | RESPIRATORY_TRACT | 11 refills | Status: DC

## 2022-05-14 NOTE — Telephone Encounter (Signed)
Requested medication (s) are due for refill today:   Yes for both  Requested medication (s) are on the active medication list:   Yes for both  Future visit scheduled:   Yes for this time next year   Last ordered: Flovent 05/08/2021 12 g, 11 refills;   Albuterol inhaler 05/08/2021  8.5 g, 3 refills  Returned because Flovent is a non delegated refill.    Requested Prescriptions  Pending Prescriptions Disp Refills   FLOVENT HFA 110 MCG/ACT inhaler [Pharmacy Med Name: [place] HFA 110 MCG INHALER] 12 g 0    Sig: INHALE 1 PUFF INTO THE LUNGS TWICE DAILY AS NEEDED     Not Delegated - Pulmonology:  Corticosteroids 2 Failed - 05/14/2022  8:56 AM      Failed - This refill cannot be delegated      Passed - Valid encounter within last 12 months    Recent Outpatient Visits           2 days ago Annual physical exam   [facility] Olin Hauser, DO   1 year ago Annual physical exam   [facility] Olin Hauser, DO   1 year ago Pre-diabetes   [place], DO   2 years ago Annual physical exam   [facility] Olin Hauser, DO   2 years ago [place], DO       Future Appointments             In 1 year [place], Devonne Doughty, [facility], [facility]             albuterol (VENTOLIN HFA) 108 (90 Base) MCG/ACT inhaler [Pharmacy Med Name: ALBUTEROL HFA 90 MCG INHALER] 8.5 g 0    Sig: Inhale2 puffs into the lungs every 6 (six) hours as needed for wheezing or shortness of breath.     Pulmonology:  Beta Agonists 2 Passed - 05/14/2022  8:56 AM      Passed - Last BP in normal range    BP Readings from Last 1 Encounters:  05/12/22 129/62         Passed - Last Heart Rate in normal range    Pulse Readings from Last 1 Encounters:  05/12/22 (!) 59         Passed - Valid encounter within last 12 months     Recent Outpatient Visits           2 days ago Annual physical exam   [place], Devonne Doughty, DO   1 year ago Annual physical exam   [place] [facility] Olin Hauser, DO   1 year ago Pre-diabetes   [facility] [place], Devonne Doughty, DO   2 years ago Annual physical exam   [facility] Olin Hauser, DO   2 years ago [facility] [place], Devonne Doughty, DO       Future Appointments             In 1 year [place], Devonne Doughty, [facility], [facility]

## 2022-06-04 ENCOUNTER — Telehealth: Payer: Self-pay | Admitting: Family Medicine

## 2022-06-04 NOTE — Telephone Encounter (Signed)
Left message for patient to call back and schedule the Medicare Annual Wellness Visit (AWV) virtually or by telephone.  Last AWV 10/20/17  Please schedule at anytime with [place].   Any questions, please call me at [phone]

## 2022-09-22 DIAGNOSIS — H2513 Age-related nuclear cataract, bilateral: Secondary | ICD-10-CM | POA: Diagnosis not present

## 2022-09-22 DIAGNOSIS — H01009 Unspecified blepharitis unspecified eye, unspecified eyelid: Secondary | ICD-10-CM | POA: Diagnosis not present

## 2022-09-22 DIAGNOSIS — H1045 Other chronic allergic conjunctivitis: Secondary | ICD-10-CM | POA: Diagnosis not present

## 2022-09-22 DIAGNOSIS — H43811 Vitreous degeneration, right eye: Secondary | ICD-10-CM | POA: Diagnosis not present

## 2022-09-22 DIAGNOSIS — H02889 Meibomian gland dysfunction of unspecified eye, unspecified eyelid: Secondary | ICD-10-CM | POA: Diagnosis not present

## 2022-09-22 DIAGNOSIS — H04123 Dry eye syndrome of bilateral lacrimal glands: Secondary | ICD-10-CM | POA: Diagnosis not present

## 2022-11-17 ENCOUNTER — Other Ambulatory Visit: Payer: Self-pay | Admitting: Family Medicine

## 2022-11-17 DIAGNOSIS — J432 Centrilobular emphysema: Secondary | ICD-10-CM

## 2022-11-17 DIAGNOSIS — J40 Bronchitis, not specified as acute or chronic: Secondary | ICD-10-CM

## 2022-11-17 MED ORDER — ANORO ELLIPTA 62.5-25 MCG/ACT IN AEPB: 1.0000 | INHALATION_SPRAY | Freq: Every day | RESPIRATORY_TRACT | 11 refills | Status: DC

## 2022-11-18 NOTE — Telephone Encounter (Signed)
Requested Prescriptions  Pending Prescriptions Disp Refills   albuterol (VENTOLIN HFA) 108 (90 Base) MCG/ACT inhaler [Pharmacy Med Name: ALBUTEROL HFA 90 MCG INHALER] 8.5 g 3    Sig: Inhale 2 puffs into the lungs every 6 (six) hours as needed for wheezing or shortness of breath.     Pulmonology:  Beta Agonists 2 Passed - 11/17/2022  9:51 AM      Passed - Last BP in normal range    BP Readings from Last 1 Encounters:  05/12/22 129/62         Passed - Last Heart Rate in normal range    Pulse Readings from Last 1 Encounters:  05/12/22 (!) 59         Passed - Valid encounter within last 12 months    Recent Outpatient Visits           6 months ago Annual physical exam   [place] [facility] [place], Netta Neat, DO   1 year ago Annual physical exam   [place] [facility] Smitty Cords, DO   2 years ago Pre-diabetes   [place] [facility] Smitty Cords, DO   2 years ago Annual physical exam   [place] [facility] Smitty Cords, DO   3 years ago Pre-diabetes   [place] [facility] Althea Charon, Netta Neat, DO       Future Appointments             In 6 months Althea Charon, Netta Neat, DO [place] [facility], [facility]

## 2023-01-23 DIAGNOSIS — D485 Neoplasm of uncertain behavior of skin: Secondary | ICD-10-CM | POA: Diagnosis not present

## 2023-01-23 DIAGNOSIS — D0439 Carcinoma in situ of skin of other parts of face: Secondary | ICD-10-CM | POA: Diagnosis not present

## 2023-01-23 DIAGNOSIS — D2272 Melanocytic nevi of left lower limb, including hip: Secondary | ICD-10-CM | POA: Diagnosis not present

## 2023-01-23 DIAGNOSIS — D2262 Melanocytic nevi of left upper limb, including shoulder: Secondary | ICD-10-CM | POA: Diagnosis not present

## 2023-01-23 DIAGNOSIS — D225 Melanocytic nevi of trunk: Secondary | ICD-10-CM | POA: Diagnosis not present

## 2023-01-23 DIAGNOSIS — C44311 Basal cell carcinoma of skin of nose: Secondary | ICD-10-CM | POA: Diagnosis not present

## 2023-01-23 DIAGNOSIS — L708 Other acne: Secondary | ICD-10-CM | POA: Diagnosis not present

## 2023-01-23 DIAGNOSIS — Z85828 Personal history of other malignant neoplasm of skin: Secondary | ICD-10-CM | POA: Diagnosis not present

## 2023-01-23 DIAGNOSIS — L821 Other seborrheic keratosis: Secondary | ICD-10-CM | POA: Diagnosis not present

## 2023-03-24 DIAGNOSIS — C44311 Basal cell carcinoma of skin of nose: Secondary | ICD-10-CM | POA: Diagnosis not present

## 2023-03-24 DIAGNOSIS — L578 Other skin changes due to chronic exposure to nonionizing radiation: Secondary | ICD-10-CM | POA: Diagnosis not present

## 2023-03-24 DIAGNOSIS — D0439 Carcinoma in situ of skin of other parts of face: Secondary | ICD-10-CM | POA: Diagnosis not present

## 2023-03-24 DIAGNOSIS — L988 Other specified disorders of the skin and subcutaneous tissue: Secondary | ICD-10-CM | POA: Diagnosis not present

## 2023-05-11 ENCOUNTER — Other Ambulatory Visit: Payer: PPO

## 2023-05-11 DIAGNOSIS — E034 Atrophy of thyroid (acquired): Secondary | ICD-10-CM

## 2023-05-11 DIAGNOSIS — E782 Mixed hyperlipidemia: Secondary | ICD-10-CM

## 2023-05-11 DIAGNOSIS — I10 Essential (primary) hypertension: Secondary | ICD-10-CM | POA: Diagnosis not present

## 2023-05-11 DIAGNOSIS — Z0000 Encounter for general adult medical examination without abnormal findings: Secondary | ICD-10-CM

## 2023-05-11 DIAGNOSIS — R7303 Prediabetes: Secondary | ICD-10-CM | POA: Diagnosis not present

## 2023-05-12 LAB — LIPID PANEL
Cholesterol: 195 mg/dL (ref <200)
HDL: 58 mg/dL (ref >=50)
LDL Cholesterol (Calc): 109 mg/dL — ABNORMAL HIGH
Non-HDL Cholesterol (Calc): 137 mg/dL — ABNORMAL HIGH (ref <130)
Total CHOL/HDL Ratio: 3.4 (calc) (ref <5.0)
Triglycerides: 164 mg/dL — ABNORMAL HIGH (ref <150)

## 2023-05-12 LAB — COMPLETE METABOLIC PANEL WITH GFR
AG Ratio: 1.6 (calc) (ref 1.0–2.5)
ALT: 13 U/L (ref 6–29)
AST: 16 U/L (ref 10–35)
Albumin: 4 g/dL (ref 3.6–5.1)
Alkaline phosphatase (APISO): 100 U/L (ref 37–153)
BUN: 19 mg/dL (ref 7–25)
CO2: 26 mmol/L (ref 20–32)
Calcium: 9.3 mg/dL (ref 8.6–10.4)
Chloride: 102 mmol/L (ref 98–110)
Creat: 0.74 mg/dL (ref 0.60–1.00)
Globulin: 2.5 g/dL (ref 1.9–3.7)
Glucose, Bld: 97 mg/dL (ref 65–99)
Potassium: 4 mmol/L (ref 3.5–5.3)
Sodium: 139 mmol/L (ref 135–146)
Total Bilirubin: 1 mg/dL (ref 0.2–1.2)
Total Protein: 6.5 g/dL (ref 6.1–8.1)
eGFR: 84 mL/min/{1.73_m2} (ref >=60)

## 2023-05-12 LAB — CBC WITH DIFFERENTIAL/PLATELET
Absolute Monocytes: 562 {cells}/uL (ref 200–950)
Basophils Absolute: 58 {cells}/uL (ref 0–200)
Basophils Relative: 0.8 %
Eosinophils Absolute: 101 {cells}/uL (ref 15–500)
Eosinophils Relative: 1.4 %
HCT: 48.7 % — ABNORMAL HIGH (ref 35.0–45.0)
Hemoglobin: 15.8 g/dL — ABNORMAL HIGH (ref 11.7–15.5)
Lymphs Abs: 1771 {cells}/uL (ref 850–3900)
MCH: 29.4 pg (ref 27.0–33.0)
MCHC: 32.4 g/dL (ref 32.0–36.0)
MCV: 90.7 fL (ref 80.0–100.0)
MPV: 10.7 fL (ref 7.5–12.5)
Monocytes Relative: 7.8 %
Neutro Abs: 4709 {cells}/uL (ref 1500–7800)
Neutrophils Relative %: 65.4 %
Platelets: 194 10*3/uL (ref 140–400)
RBC: 5.37 10*6/uL — ABNORMAL HIGH (ref 3.80–5.10)
RDW: 12.8 % (ref 11.0–15.0)
Total Lymphocyte: 24.6 %
WBC: 7.2 10*3/uL (ref 3.8–10.8)

## 2023-05-12 LAB — HEMOGLOBIN A1C
Hgb A1c MFr Bld: 6 %{Hb} — ABNORMAL HIGH (ref <5.7)
Mean Plasma Glucose: 126 mg/dL
eAG (mmol/L): 7 mmol/L

## 2023-05-12 LAB — T4, FREE: Free T4: 1.3 ng/dL (ref 0.8–1.8)

## 2023-05-12 LAB — TSH: TSH: 4.1 m[IU]/L (ref 0.40–4.50)

## 2023-05-18 ENCOUNTER — Encounter: Payer: Self-pay | Admitting: Family Medicine

## 2023-05-18 ENCOUNTER — Other Ambulatory Visit: Payer: Self-pay | Admitting: Family Medicine

## 2023-05-18 ENCOUNTER — Ambulatory Visit (INDEPENDENT_AMBULATORY_CARE_PROVIDER_SITE_OTHER): Payer: PPO | Admitting: Family Medicine

## 2023-05-18 VITALS — BP 112/60 | HR 70 | Resp 16 | Ht 62.0 in | Wt 175.6 lb

## 2023-05-18 DIAGNOSIS — Z23 Encounter for immunization: Secondary | ICD-10-CM

## 2023-05-18 DIAGNOSIS — E782 Mixed hyperlipidemia: Secondary | ICD-10-CM

## 2023-05-18 DIAGNOSIS — B0229 Other postherpetic nervous system involvement: Secondary | ICD-10-CM | POA: Diagnosis not present

## 2023-05-18 DIAGNOSIS — E034 Atrophy of thyroid (acquired): Secondary | ICD-10-CM

## 2023-05-18 DIAGNOSIS — R7303 Prediabetes: Secondary | ICD-10-CM

## 2023-05-18 DIAGNOSIS — J432 Centrilobular emphysema: Secondary | ICD-10-CM

## 2023-05-18 DIAGNOSIS — I10 Essential (primary) hypertension: Secondary | ICD-10-CM

## 2023-05-18 DIAGNOSIS — Z0000 Encounter for general adult medical examination without abnormal findings: Secondary | ICD-10-CM

## 2023-05-18 MED ORDER — ATORVASTATIN CALCIUM 10 MG PO TABS
ORAL_TABLET | ORAL | 3 refills | Status: DC
Start: 2023-05-18 — End: 2024-05-18

## 2023-05-18 MED ORDER — LISINOPRIL 20 MG PO TABS
20.0000 mg | ORAL_TABLET | Freq: Every day | ORAL | 3 refills | Status: DC
Start: 2023-05-18 — End: 2024-05-18

## 2023-05-18 MED ORDER — LEVOTHYROXINE SODIUM 100 MCG PO TABS
ORAL_TABLET | ORAL | 3 refills | Status: DC
Start: 2023-05-18 — End: 2024-05-18

## 2023-05-18 MED ORDER — GABAPENTIN 100 MG PO CAPS
200.0000 mg | ORAL_CAPSULE | Freq: Three times a day (TID) | ORAL | 3 refills | Status: DC
Start: 2023-05-18 — End: 2024-05-18

## 2023-05-18 MED ORDER — METOPROLOL TARTRATE 50 MG PO TABS
50.0000 mg | ORAL_TABLET | Freq: Two times a day (BID) | ORAL | 3 refills | Status: DC
Start: 2023-05-18 — End: 2024-05-18

## 2023-05-18 MED ORDER — BREZTRI AEROSPHERE 160-9-4.8 MCG/ACT IN AERO
2.0000 | INHALATION_SPRAY | Freq: Two times a day (BID) | RESPIRATORY_TRACT | 11 refills | Status: DC
Start: 2023-05-18 — End: 2024-06-02

## 2023-05-18 MED ORDER — HYDROCHLOROTHIAZIDE 12.5 MG PO TABS
12.5000 mg | ORAL_TABLET | Freq: Every day | ORAL | 3 refills | Status: DC
Start: 2023-05-18 — End: 2024-05-18

## 2023-05-18 NOTE — Patient Instructions (Addendum)
Thank you for coming to the office today.  Keep up the great work.  Trial on the Breztri inhaler, new device, 2 puff in AM and 2 puff in PM try to take consistently if needed.  Can re try the Anoro if you prefer to test it once. Or return to Pharmacy.  Labs reviewed  Consider a donation of blood if you can.  Recent Labs    05/11/23 0803  HGBA1C 6.0*   And TG  Goal to limit excess carb starch sugars sweets.  DUE for FASTING BLOOD WORK (no food or drink after midnight before the lab appointment, only water or coffee without cream/sugar on the morning of)  SCHEDULE "Lab Only" visit in the morning at the clinic for lab draw in 1 YEAR  - Make sure Lab Only appointment is at about 1 week before your next appointment, so that results will be available  For Lab Results, once available within 2-3 days of blood draw, you can can log in to MyChart online to view your results and a brief explanation. Also, we can discuss results at next follow-up visit.   Please schedule a Follow-up Appointment to: Return in about 1 year (around 05/17/2024) for 1 year fasting lab only then 1 week later Annual Physical (mid morning apt).  If you have any other questions or concerns, please feel free to call the office or send a message through MyChart. You may also schedule an earlier appointment if necessary.  Additionally, you may be receiving a survey about your experience at our office within a few days to 1 week by e-mail or mail. We value your feedback.  Saralyn Pilar, DO [facility], [place]

## 2023-05-18 NOTE — Assessment & Plan Note (Signed)
Controlled cholesterol on statin and lifestyle  Plan: 1. Continue current meds - Atorvastatin 10mg  2. Continue ASA 325mg  for primary ASCVD risk reduction 3. Encourage improved lifestyle - low carb/cholesterol, reduce portion size, continue improving regular exercise

## 2023-05-18 NOTE — Progress Notes (Signed)
Subjective:    Patient ID: Sarah Adkins, female    DOB: 1946/05/15, 77 y.o.   MRN: [id number]  Sarah Adkins is a 77 y.o. female presenting on 05/18/2023 for Annual Exam   HPI  Here for Annual Physical and Lab Review.  Discussed the use of AI scribe software for clinical note transcription with the patient, who gave verbal consent to proceed.       CHRONIC HTN: Reports no concerns. Home readings normal. Current Meds - HCTZ 12.5mg  daily, Lisinopril 20mg  daily, Metoprolol 50 BID   Reports good compliance, took meds today. Tolerating well, w/o complaints.    HYPERLIPIDEMIA: - Reports no concerns. Last lipid panel 04/2023, controlled mostly on Statin. LDL 109 - Currently taking Atorvastatin 10mg , tolerating well without side effects or myalgias   Pre-Diabetes BMI >32 Elevated A1c 6.0 from prior 5.6 Meds: None Currently on ACEi Lifestyle: Weight up 7 lbs in 1 year. - Diet (goal to limit excess carb starch sugar) - Exercise (less walking due to dog passed) Denies hypoglycemia, polyuria, visual changes, numbness or tingling.   Centrilobular Emphysema / COPD Previously on Flovent, had to switch. Was on Anoro but did not start or take regularly. She questions it due to package insert commenting on High BP and Thyroid condition Admits occasional dyspnea cough Admits still smoking Rarely using Albuterol.   Hypothyroidism Lab panel is normal. With TSH Controlled T4 Continues on Levothyroxine daily except skip Sundays.   Elevated Hgb Improved to 15.8 Due to smoking history. Difficulty with donating blood in past but will retry  Skin Cancer Basal cell carcinoma R lower nose - had [facility] Dr Merritt, s/p surgery in August 2024 Also has other localized skin cancer Left upper eyebrow but only using topical therapy. Not surgery     Health Maintenance:   Consider DEXA in future if interested.   Updated RSV vaccine  Due for Flu Shot today.   Colon CA  Screening: Never had colonoscopy. Currently asymptomatic. No known family history of colon CA. Due for screening test considering Cologuard, counseling given. She has it at home still thinking about it. Has the kit but has not sent it in yet. Declines Colon CA Screening again today.   Mammogram done 04/2021, negative.       10 /02/2023    9:01 AM 05/12/2022    9:49 AM 05/08/2021   10:44 AM  Depression screen PHQ 2/9  Decreased Interest 0 0 0  Down, Depressed, Hopeless 0 0 0  PHQ - 2 Score 0 0 0  Altered sleeping 1 0 0  Tired, decreased energy 0 0 0  Change in appetite 0 0 0  Feeling bad or failure about yourself  0 0 0  Trouble concentrating 0 0 0  Moving slowly or fidgety/restless 0 0 0  Suicidal thoughts 0 0 0  PHQ-9 Score 1 0 0  Difficult doing work/chores Not difficult at all Not difficult at all Not difficult at all    Past Medical History:  Diagnosis Date   Hyperthyroidism    Skin cancer of face    Past Surgical History:  Procedure Laterality Date   BREAST BIOPSY Left 2002   bx /clip-neg   FOOT SURGERY Left    HAND SURGERY Bilateral    LAPAROSCOPIC TUBAL LIGATION     Social History   Socioeconomic History   Marital status: Widowed    Spouse name: Not on file   Number of children: Not on file   Years  of education: Not on file   Highest education level: Not on file  Occupational History   Not on file  Tobacco Use   Smoking status: Every Day    Current packs/day: 0.25    Average packs/day: 0.3 packs/day for 50.0 years (12.5 ttl pk-yrs)    Types: Cigarettes   Smokeless tobacco: Current   Tobacco comments:    Prior failed Chantix. Currently cutting down < 6 cigs daily, will use NRT  Vaping Use   Vaping status: Never Used  Substance and Sexual Activity   Alcohol use: Yes    Alcohol/week: 0.0 standard drinks of alcohol    Comment: occasional beer   Drug use: No   Sexual activity: Not on file  Other Topics Concern   Not on file  Social History Narrative    Not on file   Social Determinants of Health   Financial Resource Strain: Low Risk  (10/20/2017)   Overall Financial Resource Strain (CARDIA)    Difficulty of Paying Living Expenses: Not hard at all  Food Insecurity: No Food Insecurity (10/20/2017)   Hunger Vital Sign    Worried About Running Out of Food in the Last Year: Never true    Ran Out of Food in the Last Year: Never true  Transportation Needs: No Transportation Needs (10/20/2017)   PRAPARE - Administrator, Civil Service (Medical): No    Lack of Transportation (Non-Medical): No  Physical Activity: Inactive (10/20/2017)   Exercise Vital Sign    Days of Exercise per Week: 0 days    Minutes of Exercise per Session: 0 min  Stress: No Stress Concern Present (10/20/2017)   [employer] of Occupational Health - Occupational Stress Questionnaire    Feeling of Stress : Not at all  Social Connections: Moderately Isolated (10/20/2017)   Social Connection and Isolation Panel [NHANES]    Frequency of Communication with Friends and Family: More than three times a week    Frequency of Social Gatherings with Friends and Family: More than three times a week    Attends Religious Services: Never    Database administrator or Organizations: No    Attends Banker Meetings: Never    Marital Status: Widowed  Intimate Partner Violence: Not At Risk (10/20/2017)   Humiliation, Afraid, Rape, and Kick questionnaire    Fear of Current or Ex-Partner: No    Emotionally Abused: No    Physically Abused: No    Sexually Abused: No   Family History  Problem Relation Age of Onset   Stroke Mother    Heart attack Mother    Hypertension Mother    Congestive Heart Failure Father    Seizures Brother    Breast cancer Cousin 40       mat cousin   Current Outpatient Medications on File Prior to Visit  Medication Sig   albuterol (VENTOLIN HFA) 108 (90 Base) MCG/ACT inhaler Inhale 2 puffs into the lungs every 6 (six) hours as needed  for wheezing or shortness of breath.   aspirin EC 325 MG tablet Take 325 mg by mouth daily.   Cholecalciferol (VITAMIN D3) 1000 units CAPS Take 1,000 Units by mouth daily.   No current facility-administered medications on file prior to visit.    Review of Systems  Constitutional:  Negative for activity change, appetite change, chills, diaphoresis, fatigue and fever.  HENT:  Negative for congestion and hearing loss.   Eyes:  Negative for visual disturbance.  Respiratory:  Positive for  cough. Negative for chest tightness, shortness of breath and wheezing.   Cardiovascular:  Negative for chest pain, palpitations and leg swelling.  Gastrointestinal:  Negative for abdominal pain, constipation, diarrhea, nausea and vomiting.  Genitourinary:  Negative for dysuria, frequency and hematuria.  Musculoskeletal:  Negative for arthralgias and neck pain.  Skin:  Negative for rash.  Neurological:  Negative for dizziness, weakness, light-headedness, numbness and headaches.  Hematological:  Negative for adenopathy.  Psychiatric/Behavioral:  Negative for behavioral problems, dysphoric mood and sleep disturbance.    Per HPI unless specifically indicated above      Objective:    BP 112/60 (BP Location: Left Arm, Patient Position: Sitting, Cuff Size: Normal)   Pulse 70   Resp 16   Ht 5\' 2"  (1.575 m)   Wt 175 lb 9.6 oz (79.7 kg)   BMI 32.12 kg/m   Wt Readings from Last 3 Encounters:  05/18/23 175 lb 9.6 oz (79.7 kg)  05/12/22 168 lb 9.6 oz (76.5 kg)  05/08/21 163 lb (73.9 kg)    Physical Exam Vitals and nursing note reviewed.  Constitutional:      General: She is not in acute distress.    Appearance: She is well-developed. She is not diaphoretic.     Comments: Well-appearing, comfortable, cooperative  HENT:     Head: Normocephalic and atraumatic.  Eyes:     General:        Right eye: No discharge.        Left eye: No discharge.     Conjunctiva/sclera: Conjunctivae normal.     Pupils:  Pupils are equal, round, and reactive to light.  Neck:     Thyroid: No thyromegaly.  Cardiovascular:     Rate and Rhythm: Normal rate and regular rhythm.     Pulses: Normal pulses.     Heart sounds: Normal heart sounds. No murmur heard. Pulmonary:     Effort: Pulmonary effort is normal. No respiratory distress.     Breath sounds: Normal breath sounds. No wheezing or rales.  Abdominal:     General: Bowel sounds are normal. There is no distension.     Palpations: Abdomen is soft. There is no mass.     Tenderness: There is no abdominal tenderness.  Musculoskeletal:        General: No tenderness. Normal range of motion.     Cervical back: Normal range of motion and neck supple.     Comments: Upper / Lower Extremities: - Normal muscle tone, strength bilateral upper extremities 5/5, lower extremities 5/5  Lymphadenopathy:     Cervical: No cervical adenopathy.  Skin:    General: Skin is warm and dry.     Findings: No erythema or rash.  Neurological:     Mental Status: She is alert and oriented to person, place, and time.     Comments: Distal sensation intact to light touch all extremities  Psychiatric:        Mood and Affect: Mood normal.        Behavior: Behavior normal.        Thought Content: Thought content normal.     Comments: Well groomed, good eye contact, normal speech and thoughts      Results for orders placed or performed in visit on 05/11/23  T4, free  Result Value Ref Range   Free T4 1.3 0.8 - 1.8 ng/dL  TSH  Result Value Ref Range   TSH 4.10 0.40 - 4.50 mIU/L  Hemoglobin A1c  Result Value Ref Range  Hgb A1c MFr Bld 6.0 (H) <5.7 % of total Hgb   Mean Plasma Glucose 126 mg/dL   eAG (mmol/L) 7.0 mmol/L  Lipid panel  Result Value Ref Range   Cholesterol 195 <200 mg/dL   HDL 58 > OR = 50 mg/dL   Triglycerides 161 (H) <150 mg/dL   LDL Cholesterol (Calc) 109 (H) mg/dL (calc)   Total CHOL/HDL Ratio 3.4 <5.0 (calc)   Non-HDL Cholesterol (Calc) 137 (H) <130 mg/dL  (calc)  CBC with Differential/Platelet  Result Value Ref Range   WBC 7.2 3.8 - 10.8 Thousand/uL   RBC 5.37 (H) 3.80 - 5.10 Million/uL   Hemoglobin 15.8 (H) 11.7 - 15.5 g/dL   HCT 09.6 (H) 04.5 - 40.9 %   MCV 90.7 80.0 - 100.0 fL   MCH 29.4 27.0 - 33.0 pg   MCHC 32.4 32.0 - 36.0 g/dL   RDW 81.1 91.4 - 78.2 %   Platelets 194 140 - 400 Thousand/uL   MPV 10.7 7.5 - 12.5 fL   Neutro Abs 4,709 1,500 - 7,800 cells/uL   Lymphs Abs 1,771 850 - 3,900 cells/uL   Absolute Monocytes 562 200 - 950 cells/uL   Eosinophils Absolute 101 15 - 500 cells/uL   Basophils Absolute 58 0 - 200 cells/uL   Neutrophils Relative % 65.4 %   Total Lymphocyte 24.6 %   Monocytes Relative 7.8 %   Eosinophils Relative 1.4 %   Basophils Relative 0.8 %  COMPLETE METABOLIC PANEL WITH GFR  Result Value Ref Range   Glucose, Bld 97 65 - 99 mg/dL   BUN 19 7 - 25 mg/dL   Creat 9.56 2.13 - 0.86 mg/dL   eGFR 84 > OR = 60 VH/QIO/9.62X5   BUN/Creatinine Ratio SEE NOTE: 6 - 22 (calc)   Sodium 139 135 - 146 mmol/L   Potassium 4.0 3.5 - 5.3 mmol/L   Chloride 102 98 - 110 mmol/L   CO2 26 20 - 32 mmol/L   Calcium 9.3 8.6 - 10.4 mg/dL   Total Protein 6.5 6.1 - 8.1 g/dL   Albumin 4.0 3.6 - 5.1 g/dL   Globulin 2.5 1.9 - 3.7 g/dL (calc)   AG Ratio 1.6 1.0 - 2.5 (calc)   Total Bilirubin 1.0 0.2 - 1.2 mg/dL   Alkaline phosphatase (APISO) 100 37 - 153 U/L   AST 16 10 - 35 U/L   ALT 13 6 - 29 U/L      Assessment & Plan:   Problem List Items Addressed This Visit     Centrilobular emphysema (HCC)    Stable without exacerbation, has had infrequent flares Rarely using albuterol Clinical diagnosis in past, with chronic tobacco abuse, and emphysema changes on CXR - No formal Spirometry or PFTs  Plan: Off Flovent (not available) Did not start Anoro - she had concerns about it Switch to [employer] 2 puff TWICE A DAY today for maintenance therapy Follow-up in future if need further Spirometry or PFTs, consider Pulm Counseling on  tobacco cessation      Relevant Medications   BREZTRI AEROSPHERE 160-9-4.8 MCG/ACT AERO   Essential hypertension    Well-controlled HTN - Home BP readings - normal No known complications   Plan:  1. Continue current BP regimen - HCTZ 12.5mg  daily, Lisinopril 20mg  daily, Metoprolol 50mg  BID 2. Encourage improved lifestyle - low sodium diet, regular exercise, smoking cessation 3. Continue monitor BP outside office, bring readings to next visit, if persistently >140/90 or new symptoms notify office sooner  Relevant Medications   atorvastatin (LIPITOR) 10 MG tablet   hydrochlorothiazide (HYDRODIURIL) 12.5 MG tablet   lisinopril (ZESTRIL) 20 MG tablet   metoprolol tartrate (LOPRESSOR) 50 MG tablet   Hyperlipidemia    Controlled cholesterol on statin and lifestyle  Plan: 1. Continue current meds - Atorvastatin 10mg  2. Continue ASA 325mg  for primary ASCVD risk reduction 3. Encourage improved lifestyle - low carb/cholesterol, reduce portion size, continue improving regular exercise      Relevant Medications   atorvastatin (LIPITOR) 10 MG tablet   hydrochlorothiazide (HYDRODIURIL) 12.5 MG tablet   lisinopril (ZESTRIL) 20 MG tablet   metoprolol tartrate (LOPRESSOR) 50 MG tablet   Hypothyroidism    Controlled on current therapy Last labs normal thyroid Continue levothyroxine 6 days a week, skip sunday      Relevant Medications   levothyroxine (SYNTHROID) 100 MCG tablet   metoprolol tartrate (LOPRESSOR) 50 MG tablet   Post herpetic neuralgia   Relevant Medications   gabapentin (NEURONTIN) 100 MG capsule   Pre-diabetes    A1c up to 6.0, elevated  Also hyperTG  Plan:  1. Not on any therapy currently  2. Encourage improved lifestyle - low carb, low sugar diet, reduce portion size, continue improving regular exercise      Other Visit Diagnoses     Annual physical exam    -  Primary   Needs flu shot       Relevant Orders   Flu Vaccine Trivalent High Dose  (Fluad) (Completed)      Updated Health Maintenance information Reviewed recent lab results with patient Encouraged improvement to lifestyle with diet and exercise Goal of weight loss  Assessment and Plan    Basal Cell Carcinoma Recent Mohs surgery on lower nose in August 2024. Healing well with residual stitch. Another lesion treated with topical cream. -Continue follow-up with [facility] Lorn Junes at [facility] for further management.   General Health Maintenance -Administer influenza vaccine today. -Consider bone density scan, as it has been more than five years since the last one. -Annual follow-up in one year, or sooner if needed to discuss inhaler use and repeat blood sugar testing.         Orders Placed This Encounter  Procedures   Flu Vaccine Trivalent High Dose (Fluad)     Meds ordered this encounter  Medications   atorvastatin (LIPITOR) 10 MG tablet    Sig: TAKE 1 TABLET(10 MG) BY MOUTH AT BEDTIME    Dispense:  90 tablet    Refill:  3   gabapentin (NEURONTIN) 100 MG capsule    Sig: Take 2 capsules (200 mg total) by mouth 3 (three) times daily.    Dispense:  540 capsule    Refill:  3   hydrochlorothiazide (HYDRODIURIL) 12.5 MG tablet    Sig: Take 1 tablet (12.5 mg total) by mouth daily.    Dispense:  90 tablet    Refill:  3   levothyroxine (SYNTHROID) 100 MCG tablet    Sig: TAKE 1 TABLET BY MOUTH DAILY BEFORE BREAKFAST 6 DAYS PER WEEK. DO NOT TAKE ON SUNDAYS    Dispense:  90 tablet    Refill:  3   lisinopril (ZESTRIL) 20 MG tablet    Sig: Take 1 tablet (20 mg total) by mouth daily.    Dispense:  90 tablet    Refill:  3   metoprolol tartrate (LOPRESSOR) 50 MG tablet    Sig: Take 1 tablet (50 mg total) by mouth 2 (two) times daily.  Dispense:  180 tablet    Refill:  3   BREZTRI AEROSPHERE 160-9-4.8 MCG/ACT AERO    Sig: Inhale 2 puffs into the lungs 2 (two) times daily.    Dispense:  10.7 g    Refill:  11      Follow up plan: Return in about 1 year (around  05/17/2024) for 1 year fasting lab only then 1 week later Annual Physical (mid morning apt).  Future labs ordered 05/11/24  Saralyn Pilar, DO [facility] 05/18/2023, 9:37 AM

## 2023-05-18 NOTE — Assessment & Plan Note (Signed)
Well-controlled HTN - Home BP readings - normal No known complications   Plan:  1. Continue current BP regimen - HCTZ 12.'5mg'$  daily, Lisinopril '20mg'$  daily, Metoprolol '50mg'$  BID 2. Encourage improved lifestyle - low sodium diet, regular exercise, smoking cessation 3. Continue monitor BP outside office, bring readings to next visit, if persistently >140/90 or new symptoms notify office sooner

## 2023-05-18 NOTE — Assessment & Plan Note (Signed)
A1c up to 6.0, elevated  Also hyperTG  Plan:  1. Not on any therapy currently  2. Encourage improved lifestyle - low carb, low sugar diet, reduce portion size, continue improving regular exercise

## 2023-05-18 NOTE — Assessment & Plan Note (Signed)
Stable without exacerbation, has had infrequent flares Rarely using albuterol Clinical diagnosis in past, with chronic tobacco abuse, and emphysema changes on CXR - No formal Spirometry or PFTs  Plan: Off Flovent (not available) Did not start Anoro - she had concerns about it Switch to [employer] 2 puff TWICE A DAY today for maintenance therapy Follow-up in future if need further Spirometry or PFTs, consider Pulm Counseling on tobacco cessation

## 2023-05-18 NOTE — Assessment & Plan Note (Signed)
Controlled on current therapy Last labs normal thyroid Continue levothyroxine 150mg 6 days a week, skip sunday

## 2023-05-26 ENCOUNTER — Telehealth: Payer: Self-pay

## 2023-05-26 DIAGNOSIS — Z780 Asymptomatic menopausal state: Secondary | ICD-10-CM

## 2023-05-26 NOTE — Telephone Encounter (Signed)
Left message that test requested was ordered and she may call the [facility] at [phone] to schedule.

## 2023-05-26 NOTE — Telephone Encounter (Signed)
Copied from CRM [id number]. Topic: General - Other >> May 26, 2023  9:47 AM Franchot Heidelberg wrote: Reason for CRM: Pt called requesting an order for a bone density scan, please advise

## 2023-05-26 NOTE — Telephone Encounter (Signed)
Ordered DEXA  She can Call the Imaging Center below anytime to schedule your own appointment now that order has been placed.  [facility] at [facility] [address] [place], [place] [address] Phone: [phone]   Saralyn Pilar, DO [facility] 05/26/2023, 12:10 PM

## 2023-06-03 ENCOUNTER — Ambulatory Visit
Admission: RE | Admit: 2023-06-03 | Discharge: 2023-06-03 | Disposition: A | Discharge status: Home / Self Care | Payer: PPO | Source: Ambulatory Visit | Attending: Family Medicine | Admitting: Family Medicine

## 2023-06-03 DIAGNOSIS — Z780 Asymptomatic menopausal state: Secondary | ICD-10-CM | POA: Diagnosis not present

## 2023-06-03 DIAGNOSIS — M810 Age-related osteoporosis without current pathological fracture: Secondary | ICD-10-CM | POA: Diagnosis not present

## 2023-08-14 ENCOUNTER — Encounter: Payer: Self-pay | Admitting: Emergency Medicine

## 2023-08-14 ENCOUNTER — Ambulatory Visit
Admission: EM | Admit: 2023-08-14 | Discharge: 2023-08-14 | Disposition: A | Discharge status: Home / Self Care | Payer: PPO | Attending: Physician Assistant | Admitting: Physician Assistant

## 2023-08-14 DIAGNOSIS — J441 Chronic obstructive pulmonary disease with (acute) exacerbation: Secondary | ICD-10-CM | POA: Diagnosis not present

## 2023-08-14 DIAGNOSIS — R051 Acute cough: Secondary | ICD-10-CM

## 2023-08-14 MED ORDER — PROMETHAZINE-DM 6.25-15 MG/5ML PO SYRP: 5.0000 mL | ORAL_SOLUTION | Freq: Four times a day (QID) | ORAL | 0 refills | Status: DC | PRN

## 2023-08-14 MED ORDER — DOXYCYCLINE HYCLATE 100 MG PO CAPS: 100.0000 mg | ORAL_CAPSULE | Freq: Two times a day (BID) | ORAL | 0 refills | Status: AC

## 2023-08-14 MED ORDER — PREDNISONE 20 MG PO TABS: 40.0000 mg | ORAL_TABLET | Freq: Every day | ORAL | 0 refills | Status: AC

## 2023-08-14 NOTE — ED Triage Notes (Signed)
 Pt c/o cough, runny nose, sputum production. Started about a week ago. Denies fever.

## 2023-08-14 NOTE — ED Provider Notes (Signed)
 [facility]    CSN: [id number] Arrival date & time: 08/14/23  1018      History   Chief Complaint Chief Complaint  Patient presents with   Cough    HPI Breely Panik is a 78 y.o. female with history of emphysema, tobacco abuse, hypertension, hypothyroidism, postherpetic neuralgia and skin cancer of face.  Patient presents today for 1 week history of productive cough, nasal congestion and fatigue.  Reports shorness of breath when she is moving around, but just sitting, I'm fine.  Uses Breztri  BID and albuterol  PRN, using albuterol  more frequently than normal. Denies fever, sinus pain, sore throat, chest pain, wheezing.  Has been taking OTC meds without relief.  HPI  Past Medical History:  Diagnosis Date   Hyperthyroidism    Skin cancer of face     Patient Active Problem List   Diagnosis Date Noted   Pre-diabetes 10/15/2017   Centrilobular emphysema (HCC) 06/11/2016   Tobacco abuse 06/11/2016   Hyperlipidemia 02/05/2016   Essential hypertension 10/02/2015   Hypothyroidism 10/02/2015   Post herpetic neuralgia 10/02/2015    Past Surgical History:  Procedure Laterality Date   BREAST BIOPSY Left 2002   bx /clip-neg   FOOT SURGERY Left    HAND SURGERY Bilateral    LAPAROSCOPIC TUBAL LIGATION      OB History   No obstetric history on file.      Home Medications    Prior to Admission medications   Medication Sig Start Date End Date Taking? Authorizing Provider  albuterol  (VENTOLIN  HFA) 108 (90 Base) MCG/ACT inhaler Inhale 2 puffs into the lungs every 6 (six) hours as needed for wheezing or shortness of breath. 11/18/22  Yes Karamalegos, Marsa PARAS, DO  aspirin EC 325 MG tablet Take 325 mg by mouth daily.   Yes [provider]  atorvastatin  (LIPITOR) 10 MG tablet TAKE 1 TABLET(10 MG) BY MOUTH AT BEDTIME 05/18/23  Yes Karamalegos, Marsa PARAS, DO  BREZTRI  AEROSPHERE 160-9-4.8 MCG/ACT AERO Inhale 2 puffs into the lungs 2 (two) times daily. 05/18/23   Yes Karamalegos, Marsa PARAS, DO  Cholecalciferol (VITAMIN D3) 1000 units CAPS Take 1,000 Units by mouth daily.   Yes [provider]  doxycycline  (VIBRAMYCIN ) 100 MG capsule Take 1 capsule (100 mg total) by mouth 2 (two) times daily for 7 days. 08/14/23 08/21/23 Yes Arvis Jolan NOVAK, PA-C  gabapentin  (NEURONTIN ) 100 MG capsule Take 2 capsules (200 mg total) by mouth 3 (three) times daily. 05/18/23  Yes Karamalegos, Marsa PARAS, DO  hydrochlorothiazide  (HYDRODIURIL ) 12.5 MG tablet Take 1 tablet (12.5 mg total) by mouth daily. 05/18/23  Yes Karamalegos, Marsa PARAS, DO  levothyroxine  (SYNTHROID ) 100 MCG tablet TAKE 1 TABLET BY MOUTH DAILY BEFORE BREAKFAST 6 DAYS PER WEEK. DO NOT TAKE ON SUNDAYS 05/18/23  Yes Karamalegos, Marsa PARAS, DO  lisinopril  (ZESTRIL ) 20 MG tablet Take 1 tablet (20 mg total) by mouth daily. 05/18/23  Yes Karamalegos, Marsa PARAS, DO  metoprolol  tartrate (LOPRESSOR ) 50 MG tablet Take 1 tablet (50 mg total) by mouth 2 (two) times daily. 05/18/23  Yes Karamalegos, Marsa PARAS, DO  predniSONE  (DELTASONE ) 20 MG tablet Take 2 tablets (40 mg total) by mouth daily for 5 days. 08/14/23 08/19/23 Yes Arvis Jolan B, PA-C  promethazine -dextromethorphan (PROMETHAZINE -DM) 6.25-15 MG/5ML syrup Take 5 mLs by mouth 4 (four) times daily as needed. 08/14/23  Yes Arvis Jolan NOVAK, PA-C    Family History Family History  Problem Relation Age of Onset   Stroke Mother  Heart attack Mother    Hypertension Mother    Congestive Heart Failure Father    Seizures Brother    Breast cancer Cousin 40       mat cousin    Social History Social History   Tobacco Use   Smoking status: Every Day    Current packs/day: 0.25    Average packs/day: 0.3 packs/day for 50.0 years (12.5 ttl pk-yrs)    Types: Cigarettes   Smokeless tobacco: Current   Tobacco comments:    Prior failed Chantix. Currently cutting down < 6 cigs daily, will use NRT  Vaping Use   Vaping status: Never Used  Substance Use Topics    Alcohol use: Yes    Alcohol/week: 0.0 standard drinks of alcohol    Comment: occasional beer   Drug use: No     Allergies   Erythromycin   Review of Systems Review of Systems  Constitutional:  Positive for fatigue. Negative for chills, diaphoresis and fever.  HENT:  Positive for congestion and rhinorrhea. Negative for ear pain, sinus pressure, sinus pain and sore throat.   Respiratory:  Positive for cough and shortness of breath. Negative for wheezing.   Cardiovascular:  Negative for chest pain.  Gastrointestinal:  Negative for abdominal pain, nausea and vomiting.  Musculoskeletal:  Negative for arthralgias and myalgias.  Skin:  Negative for rash.  Neurological:  Negative for weakness and headaches.  Hematological:  Negative for adenopathy.     Physical Exam Triage Vital Signs ED Triage Vitals  Encounter Vitals Group     BP      Systolic BP Percentile      Diastolic BP Percentile      Pulse      Resp      Temp      Temp src      SpO2      Weight      Height      Head Circumference      Peak Flow      Pain Score      Pain Loc      Pain Education      Exclude from Growth Chart    No data found.  Updated Vital Signs BP (!) 150/56 (BP Location: Right Arm)   Pulse 62   Temp 98.4 F (36.9 C) (Oral)   Resp 18   Ht 5' 2 (1.575 m)   Wt 167 lb (75.8 kg)   SpO2 94%   BMI 30.54 kg/m      Physical Exam Vitals and nursing note reviewed.  Constitutional:      General: She is not in acute distress.    Appearance: Normal appearance. She is not ill-appearing or toxic-appearing.  HENT:     Head: Normocephalic and atraumatic.     Nose: Congestion present.     Mouth/Throat:     Mouth: Mucous membranes are moist.     Pharynx: Oropharynx is clear.  Eyes:     General: No scleral icterus.       Right eye: No discharge.        Left eye: No discharge.     Conjunctiva/sclera: Conjunctivae normal.  Cardiovascular:     Rate and Rhythm: Normal rate and regular rhythm.      Heart sounds: Normal heart sounds.  Pulmonary:     Effort: Pulmonary effort is normal. No respiratory distress.     Breath sounds: Wheezing present.  Musculoskeletal:     Cervical back: Neck supple.  Skin:  General: Skin is dry.  Neurological:     General: No focal deficit present.     Mental Status: She is alert. Mental status is at baseline.     Motor: No weakness.     Gait: Gait normal.  Psychiatric:        Mood and Affect: Mood normal.        Behavior: Behavior normal.      UC Treatments / Results  Labs (all labs ordered are listed, but only abnormal results are displayed) Labs Reviewed - No data to display  EKG   Radiology No results found.  Procedures Procedures (including critical care time)  Medications Ordered in UC Medications - No data to display  Initial Impression / Assessment and Plan / UC Course  I have reviewed the triage vital signs and the nursing notes.  Pertinent labs & imaging results that were available during my care of the patient were reviewed by me and considered in my medical decision making (see chart for details).   78 y/o female with history of emphysema presents for productive cough and congestion x 1 week.  Denies fever, sinus pain, sore throat, chest pain or shortness of breath.  Taking OTC meds.  Patient is afebrile and overall well-appearing.  On exam has nasal congestion.  Throat is clear.  Few scattered wheezes.  COPD exacerbation.  Treating this time with doxycycline , prednisone  and Promethazine  DM.  Patient was advised to continue inhalers for COPD.  Reviewed return and ER precautions.  Acute illness systemic symptoms and flareup of chronic underlying condition.   Final Clinical Impressions(s) / UC Diagnoses   Final diagnoses:  COPD exacerbation (HCC)  Acute cough     Discharge Instructions      -COPD flareup.  Begin cough medicine, prednisone  and antibiotics.  Continue to use inhalers.  Increase rest and  fluids.  Should be feeling better over the next 1 to 2 weeks. -Return if fever, worsening cough or increased shortness of breath     ED Prescriptions     Medication Sig Dispense Auth. Provider   predniSONE  (DELTASONE ) 20 MG tablet Take 2 tablets (40 mg total) by mouth daily for 5 days. 10 tablet Arvis Huxley B, PA-C   promethazine -dextromethorphan (PROMETHAZINE -DM) 6.25-15 MG/5ML syrup Take 5 mLs by mouth 4 (four) times daily as needed. 118 mL Arvis Huxley B, PA-C   doxycycline  (VIBRAMYCIN ) 100 MG capsule Take 1 capsule (100 mg total) by mouth 2 (two) times daily for 7 days. 14 capsule Arvis Huxley NOVAK, PA-C      PDMP not reviewed this encounter.   Arvis Huxley NOVAK, PA-C 08/14/23 1205

## 2023-08-14 NOTE — Discharge Instructions (Addendum)
-  COPD flareup.  Begin cough medicine, prednisone and antibiotics.  Continue to use inhalers.  Increase rest and fluids.  Should be feeling better over the next 1 to 2 weeks. -Return if fever, worsening cough or increased shortness of breath

## 2023-09-09 ENCOUNTER — Other Ambulatory Visit: Payer: Self-pay | Admitting: Family Medicine

## 2023-09-09 DIAGNOSIS — J40 Bronchitis, not specified as acute or chronic: Secondary | ICD-10-CM

## 2023-09-11 NOTE — Telephone Encounter (Signed)
Requested Prescriptions  Pending Prescriptions Disp Refills   albuterol (VENTOLIN HFA) 108 (90 Base) MCG/ACT inhaler [Pharmacy Med Name: ALBUTEROL HFA 90 MCG INHALER] 8.5 g 1    Sig: Inhale 2 puffs into the lungs every 6 (six) hours as needed for wheezing or shortness of breath.     Pulmonology:  Beta Agonists 2 Failed - 09/11/2023  7:46 AM      Failed - Last BP in normal range    BP Readings from Last 1 Encounters:  08/14/23 (!) 150/56         Passed - Last Heart Rate in normal range    Pulse Readings from Last 1 Encounters:  08/14/23 62         Passed - Valid encounter within last 12 months    Recent Outpatient Visits           3 months ago Annual physical exam   [place] [facility] [place], Netta Neat, DO   1 year ago Annual physical exam   [place] [facility] Smitty Cords, DO   2 years ago Annual physical exam   [place] [facility] Smitty Cords, DO   2 years ago Pre-diabetes   [place] [facility] Smitty Cords, DO   3 years ago Annual physical exam   [place] [facility] Smitty Cords, DO       Future Appointments             In 8 months Althea Charon, Netta Neat, DO  [facility]

## 2023-09-24 DIAGNOSIS — H43811 Vitreous degeneration, right eye: Secondary | ICD-10-CM | POA: Diagnosis not present

## 2023-09-24 DIAGNOSIS — H04123 Dry eye syndrome of bilateral lacrimal glands: Secondary | ICD-10-CM | POA: Diagnosis not present

## 2023-09-24 DIAGNOSIS — H2513 Age-related nuclear cataract, bilateral: Secondary | ICD-10-CM | POA: Diagnosis not present

## 2023-09-24 DIAGNOSIS — H1045 Other chronic allergic conjunctivitis: Secondary | ICD-10-CM | POA: Diagnosis not present

## 2023-10-30 DIAGNOSIS — D225 Melanocytic nevi of trunk: Secondary | ICD-10-CM | POA: Diagnosis not present

## 2023-10-30 DIAGNOSIS — D2272 Melanocytic nevi of left lower limb, including hip: Secondary | ICD-10-CM | POA: Diagnosis not present

## 2023-10-30 DIAGNOSIS — L821 Other seborrheic keratosis: Secondary | ICD-10-CM | POA: Diagnosis not present

## 2023-10-30 DIAGNOSIS — Z85828 Personal history of other malignant neoplasm of skin: Secondary | ICD-10-CM | POA: Diagnosis not present

## 2023-10-30 DIAGNOSIS — D2262 Melanocytic nevi of left upper limb, including shoulder: Secondary | ICD-10-CM | POA: Diagnosis not present

## 2023-10-30 DIAGNOSIS — D2261 Melanocytic nevi of right upper limb, including shoulder: Secondary | ICD-10-CM | POA: Diagnosis not present

## 2023-12-31 ENCOUNTER — Other Ambulatory Visit: Payer: Self-pay | Admitting: Family Medicine

## 2023-12-31 DIAGNOSIS — J40 Bronchitis, not specified as acute or chronic: Secondary | ICD-10-CM

## 2024-01-01 NOTE — Telephone Encounter (Signed)
 Requested Prescriptions  Pending Prescriptions Disp Refills   albuterol  (VENTOLIN  HFA) 108 (90 Base) MCG/ACT inhaler [Pharmacy Med Name: ALBUTEROL  HFA 90 MCG INHALER] 8.5 g 0    Sig: Inhale 2 puffs into the lungs every 6 (six) hours as needed for wheezing or shortness of breath.     Pulmonology:  Beta Agonists 2 Failed - 01/01/2024 11:38 AM      Failed - Last BP in normal range    BP Readings from Last 1 Encounters:  08/14/23 (!) 150/56         Failed - Valid encounter within last 12 months    Recent Outpatient Visits   None     Future Appointments             In 4 months [place], Kayleen Party, DO [place] [facility]            Passed - Last Heart Rate in normal range    Pulse Readings from Last 1 Encounters:  08/14/23 62

## 2024-03-23 ENCOUNTER — Telehealth: Payer: Self-pay

## 2024-03-23 DIAGNOSIS — E782 Mixed hyperlipidemia: Secondary | ICD-10-CM

## 2024-03-23 DIAGNOSIS — I10 Essential (primary) hypertension: Secondary | ICD-10-CM

## 2024-03-23 DIAGNOSIS — E034 Atrophy of thyroid (acquired): Secondary | ICD-10-CM

## 2024-03-23 MED ORDER — ATORVASTATIN CALCIUM 10 MG PO TABS: 10.0000 mg | ORAL_TABLET | Freq: Every day | ORAL | 0 refills | Status: DC

## 2024-03-23 MED ORDER — HYDROCHLOROTHIAZIDE 12.5 MG PO TABS: 12.5000 mg | ORAL_TABLET | Freq: Every day | ORAL | 0 refills | Status: DC

## 2024-03-23 MED ORDER — LISINOPRIL 20 MG PO TABS: 20.0000 mg | ORAL_TABLET | Freq: Every day | ORAL | 0 refills | Status: DC

## 2024-03-23 MED ORDER — METOPROLOL TARTRATE 50 MG PO TABS: 50.0000 mg | ORAL_TABLET | Freq: Two times a day (BID) | ORAL | 0 refills | Status: DC

## 2024-03-23 MED ORDER — LEVOTHYROXINE SODIUM 100 MCG PO TABS: 100.0000 ug | ORAL_TABLET | Freq: Every day | ORAL | 0 refills | Status: DC

## 2024-03-23 NOTE — Telephone Encounter (Signed)
 Sent all 5 rx as requested   Hydrochlorothiazide  12.5mg  daily Lisinopril  20mg  daily  Metoprolol  50 TWICE A DAY   Atorvastatin  10mg  daily  Levothyroxine  100mcg daily  7 day supply [facility]  I called patient to notify her  Sarah Officer, DO [facility] 03/23/2024, 5:47 PM

## 2024-03-23 NOTE — Addendum Note (Signed)
 Addended by: EDMAN MARSA PARAS on: 03/23/2024 05:47 PM   Modules accepted: Orders

## 2024-03-23 NOTE — Telephone Encounter (Signed)
 Copied from CRM #[id number]. Topic: Clinical - Medication Question >> Mar 23, 2024  2:53 PM Kevelyn M wrote: Reason for CRM: Patient is currently visiting daughter in [place], and needs five more days worth of medication. The patient left the original pill bottles at home because she used a pill box. She is unable to tell me the names of the medication. Patient is requesting high blood pressure medication, chlolestrol, and thyroid . Please send to the Pharmacy below. Please confirm if this can be done by sending a [facility] message.  [facility] [address] [place], [place] [address]  Phone [phone]

## 2024-05-03 ENCOUNTER — Telehealth: Payer: Self-pay

## 2024-05-03 NOTE — Telephone Encounter (Signed)
 Copied from CRM #[id number]. Topic: General - Other >> May 02, 2024  4:33 PM Delon HERO wrote: Reason for CRM: [facility] is calling to report that the patient received the high does flu shot today.

## 2024-05-11 ENCOUNTER — Other Ambulatory Visit: Payer: Self-pay

## 2024-05-11 DIAGNOSIS — Z0000 Encounter for general adult medical examination without abnormal findings: Secondary | ICD-10-CM | POA: Diagnosis not present

## 2024-05-11 DIAGNOSIS — I10 Essential (primary) hypertension: Secondary | ICD-10-CM

## 2024-05-11 DIAGNOSIS — E782 Mixed hyperlipidemia: Secondary | ICD-10-CM | POA: Diagnosis not present

## 2024-05-11 DIAGNOSIS — R7303 Prediabetes: Secondary | ICD-10-CM

## 2024-05-11 DIAGNOSIS — E034 Atrophy of thyroid (acquired): Secondary | ICD-10-CM

## 2024-05-12 LAB — HEMOGLOBIN A1C
Hgb A1c MFr Bld: 5.8 % — ABNORMAL HIGH (ref <5.7)
Mean Plasma Glucose: 120 mg/dL
eAG (mmol/L): 6.6 mmol/L

## 2024-05-12 LAB — CBC WITH DIFFERENTIAL/PLATELET
Absolute Lymphocytes: 1904 {cells}/uL (ref 850–3900)
Absolute Monocytes: 621 {cells}/uL (ref 200–950)
Basophils Absolute: 51 {cells}/uL (ref 0–200)
Basophils Relative: 0.6 %
Eosinophils Absolute: 102 {cells}/uL (ref 15–500)
Eosinophils Relative: 1.2 %
HCT: 44.5 % (ref 35.0–45.0)
Hemoglobin: 14.2 g/dL (ref 11.7–15.5)
MCH: 28.3 pg (ref 27.0–33.0)
MCHC: 31.9 g/dL — ABNORMAL LOW (ref 32.0–36.0)
MCV: 88.8 fL (ref 80.0–100.0)
MPV: 10.9 fL (ref 7.5–12.5)
Monocytes Relative: 7.3 %
Neutro Abs: 5823 {cells}/uL (ref 1500–7800)
Neutrophils Relative %: 68.5 %
Platelets: 241 Thousand/uL (ref 140–400)
RBC: 5.01 Million/uL (ref 3.80–5.10)
RDW: 13.7 % (ref 11.0–15.0)
Total Lymphocyte: 22.4 %
WBC: 8.5 Thousand/uL (ref 3.8–10.8)

## 2024-05-12 LAB — COMPLETE METABOLIC PANEL WITHOUT GFR
AG Ratio: 1.4 (calc) (ref 1.0–2.5)
ALT: 12 U/L (ref 6–29)
AST: 18 U/L (ref 10–35)
Albumin: 3.9 g/dL (ref 3.6–5.1)
Alkaline phosphatase (APISO): 98 U/L (ref 37–153)
BUN: 11 mg/dL (ref 7–25)
CO2: 27 mmol/L (ref 20–32)
Calcium: 9.6 mg/dL (ref 8.6–10.4)
Chloride: 99 mmol/L (ref 98–110)
Creat: 0.67 mg/dL (ref 0.60–1.00)
Globulin: 2.7 g/dL (ref 1.9–3.7)
Glucose, Bld: 96 mg/dL (ref 65–99)
Potassium: 3.9 mmol/L (ref 3.5–5.3)
Sodium: 136 mmol/L (ref 135–146)
Total Bilirubin: 0.7 mg/dL (ref 0.2–1.2)
Total Protein: 6.6 g/dL (ref 6.1–8.1)

## 2024-05-12 LAB — LIPID PANEL
Cholesterol: 187 mg/dL (ref <200)
HDL: 63 mg/dL (ref >=50)
LDL Cholesterol (Calc): 99 mg/dL
Non-HDL Cholesterol (Calc): 124 mg/dL (ref <130)
Total CHOL/HDL Ratio: 3 (calc) (ref <5.0)
Triglycerides: 145 mg/dL (ref <150)

## 2024-05-12 LAB — TSH: TSH: 1.58 m[IU]/L (ref 0.40–4.50)

## 2024-05-12 LAB — T4, FREE: Free T4: 1.7 ng/dL (ref 0.8–1.8)

## 2024-05-18 ENCOUNTER — Ambulatory Visit: Payer: Self-pay | Admitting: Family Medicine

## 2024-05-18 ENCOUNTER — Encounter: Payer: Self-pay | Admitting: Family Medicine

## 2024-05-18 VITALS — BP 118/68 | HR 59 | Ht 62.0 in | Wt 162.0 lb

## 2024-05-18 DIAGNOSIS — E034 Atrophy of thyroid (acquired): Secondary | ICD-10-CM | POA: Diagnosis not present

## 2024-05-18 DIAGNOSIS — R7303 Prediabetes: Secondary | ICD-10-CM | POA: Diagnosis not present

## 2024-05-18 DIAGNOSIS — B0229 Other postherpetic nervous system involvement: Secondary | ICD-10-CM

## 2024-05-18 DIAGNOSIS — L650 Telogen effluvium: Secondary | ICD-10-CM

## 2024-05-18 DIAGNOSIS — Z0000 Encounter for general adult medical examination without abnormal findings: Secondary | ICD-10-CM

## 2024-05-18 DIAGNOSIS — H8113 Benign paroxysmal vertigo, bilateral: Secondary | ICD-10-CM

## 2024-05-18 DIAGNOSIS — J432 Centrilobular emphysema: Secondary | ICD-10-CM

## 2024-05-18 DIAGNOSIS — I10 Essential (primary) hypertension: Secondary | ICD-10-CM

## 2024-05-18 DIAGNOSIS — E782 Mixed hyperlipidemia: Secondary | ICD-10-CM

## 2024-05-18 MED ORDER — METOPROLOL TARTRATE 50 MG PO TABS: 50.0000 mg | ORAL_TABLET | Freq: Two times a day (BID) | ORAL | 3 refills | Status: AC

## 2024-05-18 MED ORDER — LISINOPRIL 20 MG PO TABS: 20.0000 mg | ORAL_TABLET | Freq: Every day | ORAL | 3 refills | Status: AC

## 2024-05-18 MED ORDER — HYDROCHLOROTHIAZIDE 12.5 MG PO TABS: 12.5000 mg | ORAL_TABLET | Freq: Every day | ORAL | 3 refills | Status: AC

## 2024-05-18 MED ORDER — LEVOTHYROXINE SODIUM 100 MCG PO TABS: ORAL_TABLET | ORAL | 3 refills | Status: AC

## 2024-05-18 MED ORDER — GABAPENTIN 100 MG PO CAPS
200.0000 mg | ORAL_CAPSULE | Freq: Three times a day (TID) | ORAL | 3 refills | Status: AC
Start: 2024-05-18 — End: ?

## 2024-05-18 MED ORDER — ATORVASTATIN CALCIUM 10 MG PO TABS: ORAL_TABLET | ORAL | 3 refills | Status: AC

## 2024-05-18 NOTE — Progress Notes (Unsigned)
 Subjective:    Patient ID: Sarah Adkins, female    DOB: [birth year], 78 y.o.   MRN: [id number]  Sarah Adkins is a 78 y.o. female presenting on 05/18/2024 for Annual Exam   HPI  Discussed the use of AI scribe software for clinical note transcription with the patient, who gave verbal consent to proceed.  History of Present Illness   CHRONIC HTN: Reports no concerns. Home readings normal. Current Meds - HCTZ 12.5mg  daily, Lisinopril  20mg  daily, Metoprolol  50 BID   Reports good compliance, took meds today. Tolerating well, w/o complaints.    HYPERLIPIDEMIA: - Reports no concerns. Last lipid panel 04/2024, controlled mostly on Statin. LDL 99 - Currently taking Atorvastatin  10mg , tolerating well without side effects or myalgias   Pre-Diabetes BMI >29 Elevated A1c down to 5.8, improved from 6.0 Meds: None Currently on ACEi Lifestyle: Weight down 13 lbs in 1 year - Reduced sweets and sugar Denies hypoglycemia, polyuria, visual changes, numbness or tingling.   Centrilobular Emphysema / COPD Previously on Flovent , had to switch. Was on Anoro but did not start or take regularly. She questions it due to package insert commenting on High BP and Thyroid  condition Admits occasional dyspnea cough Admits still smoking Rarely using Albuterol .  ***severe cramps in toes and feet ***hoarse improved  ***Telogen Effluvium Hair shedding  ***Positional Vertigo   Hypothyroidism Lab panel is normal. With TSH Controlled T4 Continues on Levothyroxine  daily except skip Sundays.   Elevated Hgb Improved to 14.2 Due to smoking history. Difficulty with donating blood in past but will retry   Skin Cancer Basal cell carcinoma R lower nose - had [facility] Dr Merritt, s/p surgery in August 2024 Also has other localized skin cancer Left upper eyebrow but only using topical therapy. Not surgery   Possible Kidney Stone history She has stopped the calcium supplement    Health  Maintenance:   DEXA 2024 - shows Osteoporosis.   Updated RSV vaccine   Updated Flu Vaccine today  Due Prevnar-20 vaccine, previously dose pneumonia vaccine 2018   Colon CA Screening: Never had colonoscopy. Currently asymptomatic. No known family history of colon CA. Due for screening test considering Cologuard, counseling given. She has it at home still thinking about it. Has the kit but has not sent it in yet. Declines Colon CA Screening again today.   Mammogram done 04/2021, negative.       05/18/2024    9:14 AM 05/18/2023    9:01 AM 05/12/2022    9:49 AM  Depression screen PHQ 2/9  Decreased Interest 0 0 0  Down, Depressed, Hopeless 0 0 0  PHQ - 2 Score 0 0 0  Altered sleeping 1 1 0  Tired, decreased energy 0 0 0  Change in appetite 0 0 0  Feeling bad or failure about yourself  0 0 0  Trouble concentrating 0 0 0  Moving slowly or fidgety/restless 0 0 0  Suicidal thoughts 0 0 0  PHQ-9 Score 1 1 0  Difficult doing work/chores Not difficult at all Not difficult at all Not difficult at all       10 /03/2024    9:15 AM 05/18/2023    9:02 AM 05/12/2022    9:49 AM 05/08/2021   10:44 AM  GAD 7 : Generalized Anxiety Score  Nervous, Anxious, on Edge 0 0 0 0  Control/stop worrying 0 0 0 0  Worry too much - different things 0 0 0 0  Trouble relaxing 0 0 0  0  Restless 0 0 0 0  Easily annoyed or irritable 0 0 0 0  Afraid - awful might happen 0 0 0 0  Total GAD 7 Score 0 0 0 0  Anxiety Difficulty  Not difficult at all Not difficult at all Not difficult at all     Past Medical History:  Diagnosis Date   Hyperthyroidism    Skin cancer of face    Past Surgical History:  Procedure Laterality Date   BREAST BIOPSY Left 2002   bx /clip-neg   FOOT SURGERY Left    HAND SURGERY Bilateral    LAPAROSCOPIC TUBAL LIGATION     Social History   Socioeconomic History   Marital status: Widowed    Spouse name: Not on file   Number of children: Not on file   Years of education: Not  on file   Highest education level: Not on file  Occupational History   Not on file  Tobacco Use   Smoking status: Every Day    Current packs/day: 0.25    Average packs/day: 0.3 packs/day for 50.0 years (12.5 ttl pk-yrs)    Types: Cigarettes   Smokeless tobacco: Current   Tobacco comments:    Prior failed Chantix. Currently cutting down < 6 cigs daily, will use NRT  Vaping Use   Vaping status: Never Used  Substance and Sexual Activity   Alcohol use: Yes    Alcohol/week: 0.0 standard drinks of alcohol    Comment: occasional beer   Drug use: No   Sexual activity: Not on file  Other Topics Concern   Not on file  Social History Narrative   Not on file   Social Drivers of Health   Financial Resource Strain: Low Risk  (10/20/2017)   Overall Financial Resource Strain (CARDIA)    Difficulty of Paying Living Expenses: Not hard at all  Food Insecurity: No Food Insecurity (10/20/2017)   Hunger Vital Sign    Worried About Running Out of Food in the Last Year: Never true    Ran Out of Food in the Last Year: Never true  Transportation Needs: No Transportation Needs (10/20/2017)   PRAPARE - Administrator, Civil Service (Medical): No    Lack of Transportation (Non-Medical): No  Physical Activity: Inactive (10/20/2017)   Exercise Vital Sign    Days of Exercise per Week: 0 days    Minutes of Exercise per Session: 0 min  Stress: No Stress Concern Present (10/20/2017)   [employer] of Occupational Health - Occupational Stress Questionnaire    Feeling of Stress : Not at all  Social Connections: Moderately Isolated (10/20/2017)   Social Connection and Isolation Panel    Frequency of Communication with Friends and Family: More than three times a week    Frequency of Social Gatherings with Friends and Family: More than three times a week    Attends Religious Services: Never    Database administrator or Organizations: No    Attends Banker Meetings: Never    Marital  Status: Widowed  Intimate Partner Violence: Not At Risk (10/20/2017)   Humiliation, Afraid, Rape, and Kick questionnaire    Fear of Current or Ex-Partner: No    Emotionally Abused: No    Physically Abused: No    Sexually Abused: No   Family History  Problem Relation Age of Onset   Stroke Mother    Heart attack Mother    Hypertension Mother    Congestive Heart Failure Father  Seizures Brother    Breast cancer Cousin 40       mat cousin   Current Outpatient Medications on File Prior to Visit  Medication Sig   albuterol  (VENTOLIN  HFA) 108 (90 Base) MCG/ACT inhaler Inhale 2 puffs into the lungs every 6 (six) hours as needed for wheezing or shortness of breath.   aspirin EC 325 MG tablet Take 325 mg by mouth daily.   BREZTRI  AEROSPHERE 160-9-4.8 MCG/ACT AERO Inhale 2 puffs into the lungs 2 (two) times daily.   Cholecalciferol (VITAMIN D3) 50 MCG (2000 UT) CAPS Take by mouth.   No current facility-administered medications on file prior to visit.    Review of Systems Per HPI unless specifically indicated above     Objective:    BP 118/68 (BP Location: Right Arm, Patient Position: Sitting, Cuff Size: Normal)   Pulse (!) 59   Ht 5' 2 (1.575 m)   Wt 162 lb (73.5 kg)   SpO2 94%   BMI 29.63 kg/m   Wt Readings from Last 3 Encounters:  05/18/24 162 lb (73.5 kg)  08/14/23 167 lb (75.8 kg)  05/18/23 175 lb 9.6 oz (79.7 kg)    Physical Exam  Results for orders placed or performed in visit on 05/11/24  T4, free   Collection Time: 05/11/24  8:07 AM  Result Value Ref Range   Free T4 1.7 0.8 - 1.8 ng/dL  TSH   Collection Time: 05/11/24  8:07 AM  Result Value Ref Range   TSH 1.58 0.40 - 4.50 mIU/L  CBC with Differential/Platelet   Collection Time: 05/11/24  8:07 AM  Result Value Ref Range   WBC 8.5 3.8 - 10.8 Thousand/uL   RBC 5.01 3.80 - 5.10 Million/uL   Hemoglobin 14.2 11.7 - 15.5 g/dL   HCT 55.4 64.9 - 54.9 %   MCV 88.8 80.0 - 100.0 fL   MCH 28.3 27.0 - 33.0 pg    MCHC 31.9 (L) 32.0 - 36.0 g/dL   RDW 86.2 88.9 - 84.9 %   Platelets 241 140 - 400 Thousand/uL   MPV 10.9 7.5 - 12.5 fL   Neutro Abs 5,823 1,500 - 7,800 cells/uL   Absolute Lymphocytes 1,904 850 - 3,900 cells/uL   Absolute Monocytes 621 200 - 950 cells/uL   Eosinophils Absolute 102 15 - 500 cells/uL   Basophils Absolute 51 0 - 200 cells/uL   Neutrophils Relative % 68.5 %   Total Lymphocyte 22.4 %   Monocytes Relative 7.3 %   Eosinophils Relative 1.2 %   Basophils Relative 0.6 %  COMPLETE METABOLIC PANEL WITH GFR   Collection Time: 05/11/24  8:07 AM  Result Value Ref Range   Glucose, Bld 96 65 - 99 mg/dL   BUN 11 7 - 25 mg/dL   Creat 9.32 9.39 - 8.99 mg/dL   BUN/Creatinine Ratio SEE NOTE: 6 - 22 (calc)   Sodium 136 135 - 146 mmol/L   Potassium 3.9 3.5 - 5.3 mmol/L   Chloride 99 98 - 110 mmol/L   CO2 27 20 - 32 mmol/L   Calcium  9.6 8.6 - 10.4 mg/dL   Total Protein 6.6 6.1 - 8.1 g/dL   Albumin 3.9 3.6 - 5.1 g/dL   Globulin 2.7 1.9 - 3.7 g/dL (calc)   AG Ratio 1.4 1.0 - 2.5 (calc)   Total Bilirubin 0.7 0.2 - 1.2 mg/dL   Alkaline phosphatase (APISO) 98 37 - 153 U/L   AST 18 10 - 35 U/L   ALT 12 6 -  29 U/L  Lipid panel   Collection Time: 05/11/24  8:07 AM  Result Value Ref Range   Cholesterol 187 <200 mg/dL   HDL 63 > OR = 50 mg/dL   Triglycerides 854 <849 mg/dL   LDL Cholesterol (Calc) 99 mg/dL (calc)   Total CHOL/HDL Ratio 3.0 <5.0 (calc)   Non-HDL Cholesterol (Calc) 124 <130 mg/dL (calc)  Hemoglobin J8r   Collection Time: 05/11/24  8:07 AM  Result Value Ref Range   Hgb A1c MFr Bld 5.8 (H) <5.7 %   Mean Plasma Glucose 120 mg/dL   eAG (mmol/L) 6.6 mmol/L      Assessment & Plan:   Problem List Items Addressed This Visit     Centrilobular emphysema (HCC)   Essential hypertension   Relevant Medications   hydrochlorothiazide  (HYDRODIURIL ) 12.5 MG tablet   lisinopril  (ZESTRIL ) 20 MG tablet   metoprolol  tartrate (LOPRESSOR ) 50 MG tablet   atorvastatin  (LIPITOR) 10 MG  tablet   Other Relevant Orders   CT CARDIAC SCORING (SELF PAY ONLY)   Hyperlipidemia   Relevant Medications   hydrochlorothiazide  (HYDRODIURIL ) 12.5 MG tablet   lisinopril  (ZESTRIL ) 20 MG tablet   metoprolol  tartrate (LOPRESSOR ) 50 MG tablet   atorvastatin  (LIPITOR) 10 MG tablet   Other Relevant Orders   CT CARDIAC SCORING (SELF PAY ONLY)   Hypothyroidism   Relevant Medications   levothyroxine  (SYNTHROID ) 100 MCG tablet   metoprolol  tartrate (LOPRESSOR ) 50 MG tablet   Post herpetic neuralgia   Relevant Medications   gabapentin  (NEURONTIN ) 100 MG capsule   Pre-diabetes   Other Visit Diagnoses       Annual physical exam    -  Primary        Updated Health Maintenance information ***- Reviewed recent lab results with patient Encouraged improvement to lifestyle with diet and exercise -*** Goal of weight loss  Assessment and Plan Assessment & Plan      Orders Placed This Encounter  Procedures   CT CARDIAC SCORING (SELF PAY ONLY)    Standing Status:   Future    Expiration Date:   05/18/2025    Preferred imaging location?:   [facility]    Meds ordered this encounter  Medications   hydrochlorothiazide  (HYDRODIURIL ) 12.5 MG tablet    Sig: Take 1 tablet (12.5 mg total) by mouth daily.    Dispense:  90 tablet    Refill:  3   levothyroxine  (SYNTHROID ) 100 MCG tablet    Sig: TAKE 1 TABLET BY MOUTH DAILY BEFORE BREAKFAST 6 DAYS PER WEEK. DO NOT TAKE ON SUNDAYS    Dispense:  90 tablet    Refill:  3   lisinopril  (ZESTRIL ) 20 MG tablet    Sig: Take 1 tablet (20 mg total) by mouth daily.    Dispense:  90 tablet    Refill:  3   metoprolol  tartrate (LOPRESSOR ) 50 MG tablet    Sig: Take 1 tablet (50 mg total) by mouth 2 (two) times daily.    Dispense:  180 tablet    Refill:  3   gabapentin  (NEURONTIN ) 100 MG capsule    Sig: Take 2 capsules (200 mg total) by mouth 3 (three) times daily.    Dispense:  540 capsule    Refill:  3   atorvastatin  (LIPITOR) 10 MG tablet     Sig: TAKE 1 TABLET(10 MG) BY MOUTH AT BEDTIME    Dispense:  90 tablet    Refill:  3     Follow up plan: Return  for 1 year fasting lab > 1 week later Annual Physical.  ***  Marsa Officer, DO [facility] 05/18/2024, 9:19 AM

## 2024-05-18 NOTE — Patient Instructions (Addendum)
 Thank you for coming to the office today.  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 - To treat this, try the Epley Manuever (see diagrams/instructions below) at home up to 3 times a day for 1-2 weeks or until symptoms resolve  See the next page for images describing the Epley Manuever.     ----------------------------------------------------------------------------------------------------------------------       Recommend Prevnar-20 pneumonia vaccine at the pharmacy  Breztri  1 puff per day is fine. Contact if you want to discuss alternatives.  Refills ordered   You have been referred for a Coronary Calcium  Score Cardiac CT Scan. This is a screening test for patients aged 35-50+ with cardiovascular risk factors or who are healthy but would be interested in Cardiovascular Screening for heart disease. Even if there is a family history of heart disease, this imaging can be useful. Typically it can be done every 5+ years or at a different timeline we agree on  The scan will look at the chest and mainly focus on the heart and identify early signs of calcium  build up or blockages within the heart arteries. It is not 100% accurate for identifying blockages or heart disease, but it is useful to help us  predict who may have some early changes or be at risk in the future for a heart attack or cardiovascular problem.  The results are reviewed by a Cardiologist and they will document the results. It should become available on MyChart. Typically the results are divided into percentiles based on other patients of the same demographic and age. So it will compare your risk to others similar to you. If you have a higher score >99 or higher percentile >75%tile, it is  recommended to consider Statin cholesterol therapy and or referral to Cardiologist. I will try to help explain your results and if we have questions we can contact the Cardiologist.  You will be contacted for scheduling. Usually it is done at any imaging facility through [facility], [facility] or [facility].  The cost is $99 flat fee total and it does not go through insurance, so no authorization is required.   Please schedule a Follow-up Appointment to: Return for 1 year fasting lab > 1 week later Annual Physical.  If you have any other questions or concerns, please feel free to call the office or send a message through MyChart. You may also schedule an earlier appointment if necessary.  Additionally, you may be receiving a survey about your experience at our office within a few days to 1 week by e-mail or mail. We value your feedback.  Marsa Officer, DO [facility], [place]

## 2024-05-19 ENCOUNTER — Other Ambulatory Visit: Payer: Self-pay | Admitting: Family Medicine

## 2024-05-19 DIAGNOSIS — R7303 Prediabetes: Secondary | ICD-10-CM

## 2024-05-19 DIAGNOSIS — Z0000 Encounter for general adult medical examination without abnormal findings: Secondary | ICD-10-CM

## 2024-05-19 DIAGNOSIS — E782 Mixed hyperlipidemia: Secondary | ICD-10-CM

## 2024-05-19 DIAGNOSIS — I10 Essential (primary) hypertension: Secondary | ICD-10-CM

## 2024-05-19 DIAGNOSIS — E034 Atrophy of thyroid (acquired): Secondary | ICD-10-CM

## 2024-05-26 ENCOUNTER — Ambulatory Visit
Admission: RE | Admit: 2024-05-26 | Discharge: 2024-05-26 | Disposition: A | Discharge status: Home / Self Care | Payer: Self-pay | Source: Ambulatory Visit | Attending: Family Medicine | Admitting: Family Medicine

## 2024-05-26 DIAGNOSIS — I10 Essential (primary) hypertension: Secondary | ICD-10-CM | POA: Insufficient documentation

## 2024-05-26 DIAGNOSIS — E782 Mixed hyperlipidemia: Secondary | ICD-10-CM | POA: Insufficient documentation

## 2024-06-01 ENCOUNTER — Other Ambulatory Visit: Payer: Self-pay | Admitting: Family Medicine

## 2024-06-01 DIAGNOSIS — J432 Centrilobular emphysema: Secondary | ICD-10-CM

## 2024-06-02 ENCOUNTER — Telehealth: Payer: Self-pay | Admitting: Family Medicine

## 2024-06-02 ENCOUNTER — Other Ambulatory Visit: Payer: Self-pay

## 2024-06-02 DIAGNOSIS — J432 Centrilobular emphysema: Secondary | ICD-10-CM

## 2024-06-02 MED ORDER — BREZTRI AEROSPHERE 160-9-4.8 MCG/ACT IN AERO: 2.0000 | INHALATION_SPRAY | Freq: Two times a day (BID) | RESPIRATORY_TRACT | 11 refills | Status: AC

## 2024-06-02 NOTE — Telephone Encounter (Signed)
 Left message for patient to return call OK to advise  prescription sent into pharmacy

## 2024-06-02 NOTE — Telephone Encounter (Signed)
 Prescription Request  06/02/2024  LOV: 05/18/2024  What is the name of the medication or equipment? BREZTRI  AEROSPHERE 160-9-4.8 MCG/ACT AERO   Have you contacted your pharmacy to request a refill? Yes   Which pharmacy would you like this sent to?  [facility] - [place], [place] - [address] [place] [address] Phone: [phone] Fax: [phone]    Patient notified that their request is being sent to the clinical staff for review and that they should receive a response within 2 business days.   Please advise at Mobile [phone] (mobile)

## 2024-06-03 ENCOUNTER — Ambulatory Visit: Payer: Self-pay | Admitting: Family Medicine

## 2024-06-03 NOTE — Telephone Encounter (Signed)
 Refilled 06/02/24. Requested Prescriptions  Refused Prescriptions Disp Refills   BREZTRI  AEROSPHERE 160-9-4.8 MCG/ACT AERO inhaler [Pharmacy Med Name: BREZTRI  AEROSPHERE INHALER] 10.7 g 0    Sig: Inhale 2 puffs into the lungs 2 (two) times daily.     Off-Protocol Failed - 06/03/2024 10:34 AM      Failed - Medication not assigned to a protocol, review manually.      Passed - Valid encounter within last 12 months    Recent Outpatient Visits           2 weeks ago Annual physical exam   [facility] [place], [place]a [place]AS, [place]

## 2024-07-20 ENCOUNTER — Other Ambulatory Visit: Payer: Self-pay | Admitting: Family Medicine

## 2024-07-20 DIAGNOSIS — J40 Bronchitis, not specified as acute or chronic: Secondary | ICD-10-CM

## 2024-07-22 NOTE — Telephone Encounter (Signed)
 Requested medication (s) are due for refill today: routing for review  Requested medication (s) are on the active medication list: no  Last refill:  01/01/24  Future visit scheduled: yes  Notes to clinic:  should patient continue to take, routing for review.     Requested Prescriptions  Pending Prescriptions Disp Refills   albuterol  (VENTOLIN  HFA) 108 (90 Base) MCG/ACT inhaler [Pharmacy Med Name: ALBUTEROL  HFA 90 MCG INHALER] 8.5 g 0    Sig: Inhale 2 puffs into the lungs every 6 (six) hours as needed for wheezing or shortness of breath.     Pulmonology:  Beta Agonists 2 Passed - 07/22/2024  8:33 AM      Passed - Last BP in normal range    BP Readings from Last 1 Encounters:  05/18/24 118/68         Passed - Last Heart Rate in normal range    Pulse Readings from Last 1 Encounters:  05/18/24 (!) 59         Passed - Valid encounter within last 12 months    Recent Outpatient Visits           2 months ago Annual physical exam   [facility] [place], [place], [place]

## 2025-05-18 ENCOUNTER — Other Ambulatory Visit

## 2025-05-25 ENCOUNTER — Encounter: Admitting: Family Medicine
